# Patient Record
Sex: Female | Born: 1970 | Race: White | Hispanic: No | Marital: Single | Smoking: Never smoker
Health system: Southern US, Community
[De-identification: ages and names within clinical notes are randomized; demographics above are authoritative.]

## PROBLEM LIST (undated history)

## (undated) DIAGNOSIS — R0789 Other chest pain: Secondary | ICD-10-CM

## (undated) DIAGNOSIS — R002 Palpitations: Secondary | ICD-10-CM

## (undated) DIAGNOSIS — F32A Depression, unspecified: Secondary | ICD-10-CM

## (undated) DIAGNOSIS — F329 Major depressive disorder, single episode, unspecified: Secondary | ICD-10-CM

## (undated) DIAGNOSIS — C801 Malignant (primary) neoplasm, unspecified: Secondary | ICD-10-CM

## (undated) DIAGNOSIS — G43909 Migraine, unspecified, not intractable, without status migrainosus: Secondary | ICD-10-CM

## (undated) DIAGNOSIS — R739 Hyperglycemia, unspecified: Secondary | ICD-10-CM

## (undated) DIAGNOSIS — F319 Bipolar disorder, unspecified: Secondary | ICD-10-CM

## (undated) DIAGNOSIS — E162 Hypoglycemia, unspecified: Secondary | ICD-10-CM

## (undated) DIAGNOSIS — F988 Other specified behavioral and emotional disorders with onset usually occurring in childhood and adolescence: Secondary | ICD-10-CM

## (undated) HISTORY — DX: Other chest pain: R07.89

## (undated) HISTORY — PX: TUBAL LIGATION: SHX77

## (undated) HISTORY — DX: Palpitations: R00.2

---

## 1997-06-08 ENCOUNTER — Inpatient Hospital Stay (HOSPITAL_COMMUNITY): Admission: AD | Admit: 1997-06-08 | Discharge: 1997-06-08 | Payer: Self-pay | Admitting: Obstetrics

## 1997-06-13 ENCOUNTER — Inpatient Hospital Stay (HOSPITAL_COMMUNITY): Admission: AD | Admit: 1997-06-13 | Discharge: 1997-06-13 | Payer: Self-pay | Admitting: Obstetrics

## 1997-07-12 ENCOUNTER — Inpatient Hospital Stay (HOSPITAL_COMMUNITY): Admission: AD | Admit: 1997-07-12 | Discharge: 1997-07-12 | Payer: Self-pay | Admitting: *Deleted

## 1997-07-21 ENCOUNTER — Inpatient Hospital Stay (HOSPITAL_COMMUNITY): Admission: AD | Admit: 1997-07-21 | Discharge: 1997-07-21 | Payer: Self-pay | Admitting: Obstetrics & Gynecology

## 1997-12-17 ENCOUNTER — Emergency Department (HOSPITAL_COMMUNITY): Admission: EM | Admit: 1997-12-17 | Discharge: 1997-12-17 | Payer: Self-pay | Admitting: Emergency Medicine

## 1997-12-20 ENCOUNTER — Emergency Department (HOSPITAL_COMMUNITY): Admission: EM | Admit: 1997-12-20 | Discharge: 1997-12-20 | Payer: Self-pay | Admitting: Emergency Medicine

## 1999-10-05 ENCOUNTER — Emergency Department (HOSPITAL_COMMUNITY): Admission: EM | Admit: 1999-10-05 | Discharge: 1999-10-05 | Payer: Self-pay | Admitting: Emergency Medicine

## 1999-10-22 ENCOUNTER — Emergency Department (HOSPITAL_COMMUNITY): Admission: EM | Admit: 1999-10-22 | Discharge: 1999-10-22 | Payer: Self-pay | Admitting: Emergency Medicine

## 1999-12-11 ENCOUNTER — Emergency Department (HOSPITAL_COMMUNITY): Admission: EM | Admit: 1999-12-11 | Discharge: 1999-12-11 | Payer: Self-pay | Admitting: Emergency Medicine

## 1999-12-22 ENCOUNTER — Emergency Department (HOSPITAL_COMMUNITY): Admission: EM | Admit: 1999-12-22 | Discharge: 1999-12-22 | Payer: Self-pay | Admitting: *Deleted

## 2000-01-03 ENCOUNTER — Emergency Department (HOSPITAL_COMMUNITY): Admission: EM | Admit: 2000-01-03 | Discharge: 2000-01-03 | Payer: Self-pay | Admitting: Emergency Medicine

## 2000-01-03 ENCOUNTER — Encounter: Payer: Self-pay | Admitting: Emergency Medicine

## 2000-04-06 ENCOUNTER — Encounter: Payer: Self-pay | Admitting: Emergency Medicine

## 2000-04-06 ENCOUNTER — Emergency Department (HOSPITAL_COMMUNITY): Admission: EM | Admit: 2000-04-06 | Discharge: 2000-04-06 | Payer: Self-pay | Admitting: *Deleted

## 2000-07-28 ENCOUNTER — Inpatient Hospital Stay (HOSPITAL_COMMUNITY): Admission: AD | Admit: 2000-07-28 | Discharge: 2000-07-28 | Payer: Self-pay | Admitting: Obstetrics and Gynecology

## 2000-08-18 ENCOUNTER — Observation Stay (HOSPITAL_COMMUNITY): Admission: AD | Admit: 2000-08-18 | Discharge: 2000-08-18 | Payer: Self-pay

## 2000-08-26 ENCOUNTER — Inpatient Hospital Stay (HOSPITAL_COMMUNITY): Admission: AD | Admit: 2000-08-26 | Discharge: 2000-08-26 | Payer: Self-pay | Admitting: Obstetrics and Gynecology

## 2000-08-27 ENCOUNTER — Inpatient Hospital Stay (HOSPITAL_COMMUNITY): Admission: AD | Admit: 2000-08-27 | Discharge: 2000-08-27 | Payer: Self-pay | Admitting: Obstetrics and Gynecology

## 2000-09-10 ENCOUNTER — Other Ambulatory Visit: Admission: RE | Admit: 2000-09-10 | Discharge: 2000-09-10 | Payer: Self-pay | Admitting: Obstetrics and Gynecology

## 2000-10-22 ENCOUNTER — Ambulatory Visit (HOSPITAL_COMMUNITY): Admission: RE | Admit: 2000-10-22 | Discharge: 2000-10-22 | Payer: Self-pay | Admitting: Obstetrics and Gynecology

## 2000-10-22 ENCOUNTER — Encounter: Payer: Self-pay | Admitting: Obstetrics and Gynecology

## 2001-01-12 ENCOUNTER — Ambulatory Visit (HOSPITAL_COMMUNITY): Admission: RE | Admit: 2001-01-12 | Discharge: 2001-01-12 | Payer: Self-pay | Admitting: Obstetrics and Gynecology

## 2001-01-12 ENCOUNTER — Encounter: Payer: Self-pay | Admitting: Obstetrics and Gynecology

## 2001-01-19 ENCOUNTER — Inpatient Hospital Stay (HOSPITAL_COMMUNITY): Admission: AD | Admit: 2001-01-19 | Discharge: 2001-01-19 | Payer: Self-pay | Admitting: Obstetrics and Gynecology

## 2001-02-26 ENCOUNTER — Encounter: Payer: Self-pay | Admitting: Obstetrics and Gynecology

## 2001-02-26 ENCOUNTER — Ambulatory Visit (HOSPITAL_COMMUNITY): Admission: RE | Admit: 2001-02-26 | Discharge: 2001-02-26 | Payer: Self-pay | Admitting: Obstetrics and Gynecology

## 2001-03-19 ENCOUNTER — Inpatient Hospital Stay (HOSPITAL_COMMUNITY): Admission: AD | Admit: 2001-03-19 | Discharge: 2001-03-22 | Payer: Self-pay | Admitting: Obstetrics and Gynecology

## 2003-12-09 ENCOUNTER — Emergency Department (HOSPITAL_COMMUNITY): Admission: EM | Admit: 2003-12-09 | Discharge: 2003-12-09 | Payer: Self-pay | Admitting: *Deleted

## 2003-12-11 ENCOUNTER — Emergency Department (HOSPITAL_COMMUNITY): Admission: EM | Admit: 2003-12-11 | Discharge: 2003-12-11 | Payer: Self-pay | Admitting: Emergency Medicine

## 2004-01-30 ENCOUNTER — Emergency Department (HOSPITAL_COMMUNITY): Admission: EM | Admit: 2004-01-30 | Discharge: 2004-01-30 | Payer: Self-pay | Admitting: Family Medicine

## 2004-02-04 ENCOUNTER — Emergency Department (HOSPITAL_COMMUNITY): Admission: EM | Admit: 2004-02-04 | Discharge: 2004-02-04 | Payer: Self-pay | Admitting: Family Medicine

## 2004-05-01 ENCOUNTER — Emergency Department (HOSPITAL_COMMUNITY): Admission: EM | Admit: 2004-05-01 | Discharge: 2004-05-01 | Payer: Self-pay | Admitting: Family Medicine

## 2005-02-28 ENCOUNTER — Emergency Department (HOSPITAL_COMMUNITY): Admission: EM | Admit: 2005-02-28 | Discharge: 2005-02-28 | Payer: Self-pay | Admitting: Emergency Medicine

## 2005-05-01 ENCOUNTER — Emergency Department (HOSPITAL_COMMUNITY): Admission: EM | Admit: 2005-05-01 | Discharge: 2005-05-01 | Payer: Self-pay | Admitting: Family Medicine

## 2005-05-21 ENCOUNTER — Emergency Department (HOSPITAL_COMMUNITY): Admission: EM | Admit: 2005-05-21 | Discharge: 2005-05-21 | Payer: Self-pay | Admitting: Family Medicine

## 2005-06-02 ENCOUNTER — Emergency Department: Payer: Self-pay | Admitting: Emergency Medicine

## 2005-06-06 ENCOUNTER — Emergency Department: Payer: Self-pay | Admitting: Emergency Medicine

## 2005-07-25 ENCOUNTER — Emergency Department: Payer: Self-pay | Admitting: Emergency Medicine

## 2005-11-22 ENCOUNTER — Emergency Department: Payer: Self-pay | Admitting: Emergency Medicine

## 2005-11-27 ENCOUNTER — Emergency Department: Payer: Self-pay | Admitting: Emergency Medicine

## 2005-12-04 ENCOUNTER — Emergency Department: Payer: Self-pay | Admitting: Emergency Medicine

## 2005-12-07 ENCOUNTER — Emergency Department (HOSPITAL_COMMUNITY): Admission: EM | Admit: 2005-12-07 | Discharge: 2005-12-07 | Payer: Self-pay | Admitting: Emergency Medicine

## 2005-12-31 ENCOUNTER — Emergency Department (HOSPITAL_COMMUNITY): Admission: EM | Admit: 2005-12-31 | Discharge: 2005-12-31 | Payer: Self-pay | Admitting: Family Medicine

## 2008-02-29 ENCOUNTER — Emergency Department (HOSPITAL_COMMUNITY): Admission: EM | Admit: 2008-02-29 | Discharge: 2008-02-29 | Payer: Self-pay | Admitting: Emergency Medicine

## 2008-03-02 ENCOUNTER — Emergency Department (HOSPITAL_COMMUNITY): Admission: EM | Admit: 2008-03-02 | Discharge: 2008-03-02 | Payer: Self-pay | Admitting: Emergency Medicine

## 2008-03-03 ENCOUNTER — Ambulatory Visit (HOSPITAL_COMMUNITY): Admission: RE | Admit: 2008-03-03 | Discharge: 2008-03-03 | Payer: Self-pay | Admitting: Emergency Medicine

## 2008-03-06 ENCOUNTER — Emergency Department (HOSPITAL_COMMUNITY): Admission: EM | Admit: 2008-03-06 | Discharge: 2008-03-06 | Payer: Self-pay | Admitting: Emergency Medicine

## 2008-03-09 ENCOUNTER — Encounter: Admission: RE | Admit: 2008-03-09 | Discharge: 2008-03-09 | Payer: Self-pay | Admitting: Neurological Surgery

## 2008-03-30 ENCOUNTER — Ambulatory Visit (HOSPITAL_COMMUNITY): Admission: RE | Admit: 2008-03-30 | Discharge: 2008-03-31 | Payer: Self-pay | Admitting: Neurological Surgery

## 2008-04-22 ENCOUNTER — Inpatient Hospital Stay (HOSPITAL_COMMUNITY): Admission: AD | Admit: 2008-04-22 | Discharge: 2008-04-22 | Payer: Self-pay | Admitting: Obstetrics & Gynecology

## 2008-05-09 ENCOUNTER — Encounter: Admission: RE | Admit: 2008-05-09 | Discharge: 2008-05-09 | Payer: Self-pay | Admitting: Neurological Surgery

## 2008-07-25 ENCOUNTER — Encounter: Admission: RE | Admit: 2008-07-25 | Discharge: 2008-07-25 | Payer: Self-pay | Admitting: Neurological Surgery

## 2008-08-26 ENCOUNTER — Ambulatory Visit (HOSPITAL_COMMUNITY): Admission: RE | Admit: 2008-08-26 | Discharge: 2008-08-26 | Payer: Self-pay | Admitting: Obstetrics & Gynecology

## 2008-11-09 ENCOUNTER — Emergency Department (HOSPITAL_COMMUNITY): Admission: EM | Admit: 2008-11-09 | Discharge: 2008-11-09 | Payer: Self-pay | Admitting: Emergency Medicine

## 2010-06-14 IMAGING — RF DG CERVICAL SPINE 1V
1 series · 1 of 1 positions shown · non-contrast
Comparison: 03/03/2008.

CLINICAL DATA: C5-6 ACDF.

CERVICAL SPINE - 1 VIEW

[Series 1: run · 1 of 1 slices shown]
[im 1/1]
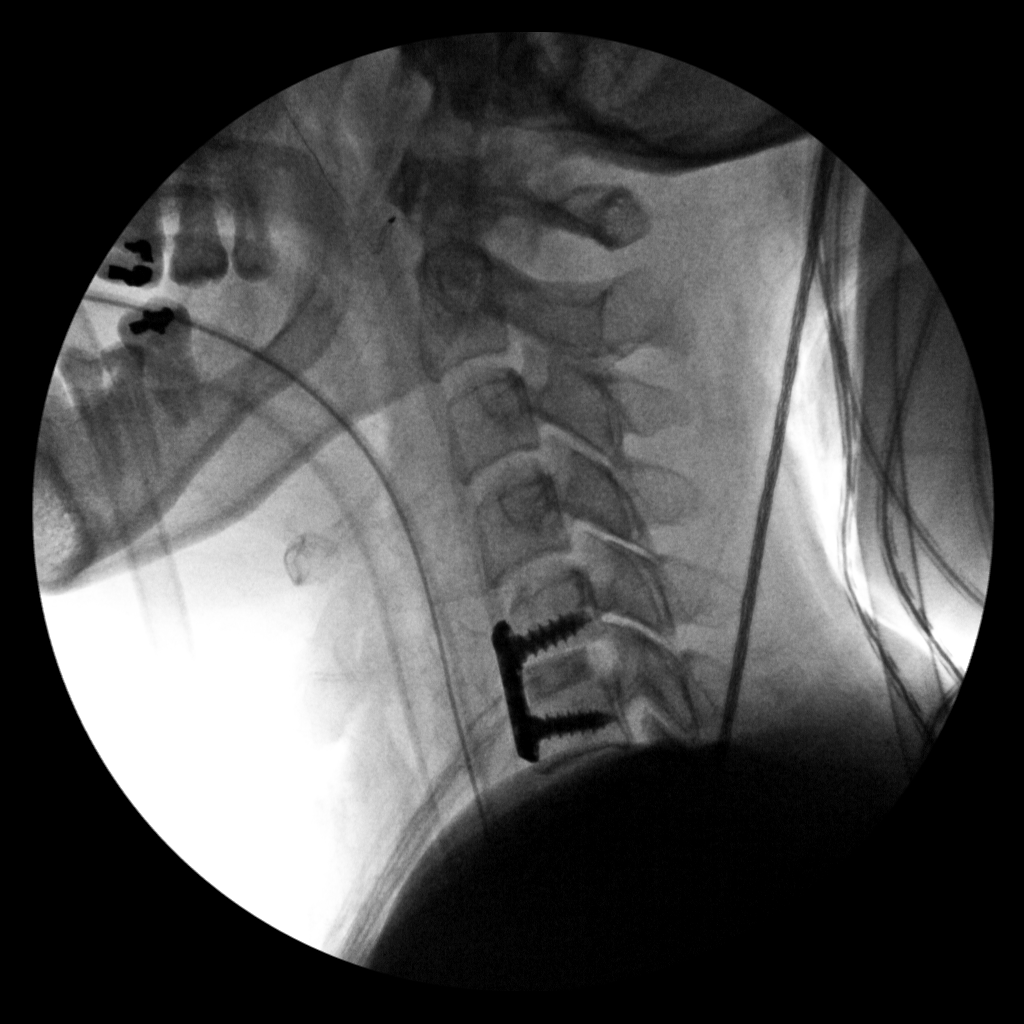

[1 of 1 positions shown; findings below may reference images not displayed]

FINDINGS: There has been anterior discectomy and fusion at C5-6.
Anterior plate and interbody bone graft at C5-6 are in good
position.  The alignment is normal and the remaining disc spaces
are intact.
IMPRESSION: Satisfactory ACDF at C5-6.

## 2010-06-18 LAB — CBC
MCHC: 34.1 g/dL (ref 30.0–36.0)
Platelets: 161 10*3/uL (ref 150–400)
RDW: 14.2 % (ref 11.5–15.5)

## 2010-06-18 LAB — PREGNANCY, URINE: Preg Test, Ur: NEGATIVE

## 2010-06-25 LAB — BASIC METABOLIC PANEL
BUN: 10 mg/dL (ref 6–23)
Calcium: 9.4 mg/dL (ref 8.4–10.5)
GFR calc non Af Amer: >60 mL/min (ref >60)
Glucose, Bld: 96 mg/dL (ref 70–99)
Potassium: 4.4 mEq/L (ref 3.5–5.1)
Sodium: 139 mEq/L (ref 135–145)

## 2010-06-25 LAB — APTT: aPTT: 25 seconds (ref 24–37)

## 2010-06-25 LAB — CBC
HCT: 39.8 % (ref 36.0–46.0)
Hemoglobin: 13.4 g/dL (ref 12.0–15.0)
Platelets: 175 10*3/uL (ref 150–400)
RDW: 12.9 % (ref 11.5–15.5)
WBC: 7.6 10*3/uL (ref 4.0–10.5)

## 2010-06-25 LAB — PROTIME-INR: Prothrombin Time: 13.6 seconds (ref 11.6–15.2)

## 2010-06-25 LAB — DIFFERENTIAL
Basophils Absolute: 0 10*3/uL (ref 0.0–0.1)
Eosinophils Relative: 1 % (ref 0–5)
Lymphocytes Relative: 25 % (ref 12–46)
Lymphs Abs: 1.9 10*3/uL (ref 0.7–4.0)
Neutro Abs: 5 10*3/uL (ref 1.7–7.7)

## 2010-06-26 LAB — CBC
HCT: 37.5 % (ref 36.0–46.0)
Hemoglobin: 12.6 g/dL (ref 12.0–15.0)
MCHC: 33.5 g/dL (ref 30.0–36.0)
MCV: 89.3 fL (ref 78.0–100.0)
Platelets: 162 10*3/uL (ref 150–400)
RBC: 4.21 MIL/uL (ref 3.87–5.11)
RDW: 13.5 % (ref 11.5–15.5)
WBC: 6.3 10*3/uL (ref 4.0–10.5)

## 2010-06-26 LAB — WET PREP, GENITAL
Clue Cells Wet Prep HPF POC: NONE SEEN
Trich, Wet Prep: NONE SEEN
WBC, Wet Prep HPF POC: NONE SEEN
Yeast Wet Prep HPF POC: NONE SEEN

## 2010-06-26 LAB — POCT PREGNANCY, URINE: Preg Test, Ur: NEGATIVE

## 2010-07-02 ENCOUNTER — Emergency Department (HOSPITAL_COMMUNITY)
Admission: EM | Admit: 2010-07-02 | Discharge: 2010-07-02 | Disposition: A | Discharge status: Home / Self Care | Payer: Self-pay | Attending: Emergency Medicine | Admitting: Emergency Medicine

## 2010-07-02 DIAGNOSIS — K047 Periapical abscess without sinus: Secondary | ICD-10-CM | POA: Insufficient documentation

## 2010-07-02 DIAGNOSIS — J45909 Unspecified asthma, uncomplicated: Secondary | ICD-10-CM | POA: Insufficient documentation

## 2010-07-02 DIAGNOSIS — R110 Nausea: Secondary | ICD-10-CM | POA: Insufficient documentation

## 2010-07-02 DIAGNOSIS — K089 Disorder of teeth and supporting structures, unspecified: Secondary | ICD-10-CM | POA: Insufficient documentation

## 2010-07-24 NOTE — Op Note (Signed)
NAME:  Colleen Mccullough, Colleen Mccullough           ACCOUNT NO.:  [id number]   MEDICAL RECORD NO.:  [id number]          PATIENT TYPE:  AMB   LOCATION:  SDC                           FACILITY:  [facility]   PHYSICIAN:  Roseanna Rainbow, M.D.DATE OF BIRTH:  1970/09/10   DATE OF PROCEDURE:  08/26/2008  DATE OF DISCHARGE:  08/26/2008                               OPERATIVE REPORT   PREOPERATIVE DIAGNOSIS:  Multiparity, desires sterilization procedure.   POSTOPERATIVE DIAGNOSES:  Multiparity, desires sterilization procedure,  pelvic adhesions.   PROCEDURE:  Laparoscopic bilateral tubal ligation with fulguration and  lysis of adhesions.   SURGEON:  Roseanna Rainbow, MD   ANESTHESIA:  General endotracheal.   FINDINGS:  Omental adhesions and adhesions involving the posterior cul-  de-sac.   PATHOLOGY:  None.   ESTIMATED BLOOD LOSS:  Minimal.   COMPLICATIONS:  None.   PROCEDURE:  The patient was taken to the operating room with an IV  running.  She was given general anesthesia and prepped and draped in the  usual sterile fashion.  A bivalve speculum was placed in the patient's  vagina.  The anterior lip of the cervix was then grasped with a single-  tooth tenaculum.  The Hulka manipulator was then advanced into the  cervix and secured to the anterior lip of the cervix with a means to  manipulate the uterus.  Single-tooth tenaculum and speculum were then  removed.  After a time-out had been completed, an infraumbilical skin  incision was then made with a scalpel.  Using the OptiVu, a 10-mm trocar  and sleeve were then advanced into the abdomen under direct  visualization.  The abdomen was then insufflated with CO2 gas.  A survey  of the pelvic viscera revealed a adhesions involving the posterior cul-  de-sac and sigmoid colon.  There was a peritoneal window noted.  There  are also filmy adhesions involving the omentum to the anterior abdominal  wall in the right pelvis.  Using Endo shears, the  above-noted omental  adhesions were divided.  The left fallopian tube was then identified and  followed out to the fimbriated end.  A 2-cm segment of the mid isthmic  portion of the tube was then cauterized.  With each application of the  bipolar device, the ohmmeter was noted to go to 0.  The right fallopian  tube was manipulated in a similar fashion.  All the instruments were  then removed from the abdomen.  The fascia was reapproximated with  interrupted suture of O Vicryl.  The skin was closed with Dermabond.  The Hulka manipulator  was removed from the vagina with minimal bleeding noted from the cervix.  At the close of the procedure, the instrument and pack counts were said  to be correct x2.  The patient was taken to the PACU awake and in stable  condition.      Roseanna Rainbow, M.D.  Electronically Signed     LAJ/MEDQ  D:  08/26/2008  T:  08/27/2008  Job:  [id number]

## 2010-07-24 NOTE — Op Note (Signed)
NAME:  Colleen Mccullough, ALBIN NO.:  [id number]   MEDICAL RECORD NO.:  [id number]          PATIENT TYPE:  OIB   LOCATION:  3533                         FACILITY:  [facility]   PHYSICIAN:  Tia Alert, MD     DATE OF BIRTH:  13-Sep-1970   DATE OF PROCEDURE:  03/30/2008  DATE OF DISCHARGE:                               OPERATIVE REPORT   PREOPERATIVE DIAGNOSIS:  Cervical disk herniation, C5-6 with neck and  left arm pain.   POSTOPERATIVE DIAGNOSIS:  Cervical disk herniation, C5-6 with neck and  left arm pain.   PROCEDURE:  1. Decompressive anterior cervical diskectomy, C5-6.  2. Anterior cervical arthrodesis, C5-6 utilizing a 6-mm      corticocancellous allograft.  3. Anterior cervical plating, C5-6 utilizing a Biomet plate.   SURGEON:  Tia Alert, MD   ASSISTANT:  Donalee Citrin, MD   ANESTHESIA:  General endotracheal.   COMPLICATIONS:  None apparent.   INDICATIONS FOR PROCEDURE:  [name] is a 40 year old female who is  referred for the emergency department with a large cervical disk  herniation at C5-6 with severe left arm pain.  She tried medical  management without relief.  I recommended decompressive anterior  cervical diskectomy at C5-6.  She understood the risks, benefits,  expected outcome, and wished to proceed.   DESCRIPTION OF PROCEDURE:  The patient was taken to operating room and  after induction of adequate generalized endotracheal anesthesia, she was  placed in a supine position on the operating room table.  Her right  anterior cervical region was prepped with DuraPrep and then draped in  usual sterile fashion.  A 3 mL of local anesthesia was injected and a  transverse incision was made to the right of midline and carried down to  the platysma, which was elevated, opened, and undermined with Metzenbaum  scissors.  I then dissected on the plane medial to the  sternocleidomastoid muscle and internal carotid artery and lateral to  the trachea and  esophagus to expose C5-6.  Intraoperative fluoroscopy  confirmed the level of C5-6 and then the annulus was incised and the  initial diskectomy was done with pituitary rongeurs and curved with  curettes.  I used the high-speed drill to drill the endplates to prepare  for further later arthrodesis.  I drilled down to the level of the  posterior longitudinal ligament, opened this with a nerve hook and then  removed it.  While undercutting the body of C5-C6 a large disk  herniation was noticed and removed at the C5-6 on the left.  The  bilateral foraminotomies were performed paying particular attention to  the left side because of her left-sided pain.  We identified the  pedicle, then identified the nerve root and marched along the nerve root  to decompress distally into its foramen.  Once our decompression was  complete, I palpated circumferentially with a nerve hook to assure  adequate decompression.  The nerve looked free and felt free.  Therefore, I irrigated with saline solution, dried all bleeding with  Gelfoam and with irrigation.  I then measured the interspace to be 6  mm  and tapped a 6-mm corticocancellous allograft into the interspace at C5-  6.  I then used a Biomet plate and placed two 13-mm variable angle  screws into the bodies of C5 and C6 and these locked in the plate by  locking mechanism within the plate.  I then spent considerable time  drying the surgical bed, irrigating the surgical bed, and once  meticulous hemostasis was achieved, closed the platysma with 3-0 Vicryl,  closed the subcuticular tissue with 3-0 Vicryl, and closed the skin with  benzoin and Steri-Strips.  The drapes were removed.  A sterile dressing  was applied.  The patient awakened from general anesthesia and  transferred to the recovery room in stable condition.  At the end of the  procedure, all sponge, needle, and instrument counts were correct.      Tia Alert, MD  Electronically  Signed     DSJ/MEDQ  D:  03/30/2008  T:  03/31/2008  Job:  [id number]

## 2010-07-27 NOTE — Discharge Summary (Signed)
[facility]  Patient:    Colleen Mccullough, Colleen Mccullough Visit Number: [id number] MRN: [id number]          Service Type: OBS Location: 910A 9131 01 Attending Physician:  Shaune Spittle Dictated by:   Mack Guise, C.N.M. Admit Date:  03/19/2001 Discharge Date: 03/22/2001                             Discharge Summary  ADMISSION DIAGNOSES: 1. Intrauterine pregnancy at term. 2. Prior cesarean section, desires repeat.  PROCEDURE:  Repeat low transverse cesarean section.  DISCHARGE DIAGNOSES: 1. Intrauterine pregnancy at term. 2. Prior cesarean section, desires repeat.  HISTORY OF PRESENT ILLNESS:  Ms. Joanette Gula is a gravida 5, para 1-0-3-1, who presented at term for repeat cesarean section by Dr. Dierdre Forth, with the birth of a 6 pound 4 ounce female infant named Brianna with Apgar scores of 8 at one minute and 9 at five minutes.  The patient has done well in the postpartum period.  Her hemoglobin on the first postpartum day was 8.1.  She has not suffered any ill effects from her anemia.  No syncope, no dizziness. She has opted not to have blood transfusion.  Otherwise, she has remained stable.  Her incision is clean and dry.  On this, her third postoperative day, she is deemed to be in satisfactory condition for discharge.  DISCHARGE INSTRUCTIONS:  Per [facility] handout.  DISCHARGE MEDICATIONS: 1. Percocet. 2. Motrin. 3. Prenatal vitamins. 4. Iron supplement b.i.d.  FOLLOWUP:  The patient will follow up at the office of [facility] in six weeks. Dictated by:   Mack Guise, C.N.M. Attending Physician:  Shaune Spittle DD:  03/22/01 TD:  03/23/01 Job: [id number]

## 2010-07-27 NOTE — Op Note (Signed)
[facility]  Patient:    Colleen Mccullough, Colleen Mccullough Visit Number: [id number] MRN: [id number]          Service Type: OBS Location: 910A 9131 01 Attending Physician:  Shaune Spittle Dictated by:   Maris Berger. Pennie Rushing, M.D. Proc. Date: 03/19/01 Admit Date:  03/19/2001                             Operative Report  PREOPERATIVE DIAGNOSES:       1. Intrauterine pregnancy at term.                               2. Prior cesarean section.                               3. Desire for repeat cesarean section.  POSTOPERATIVE DIAGNOSES:      1. Intrauterine pregnancy at term.                               2. Prior cesarean section.                               3. Desire for repeat cesarean section.  OPERATION:                    Repeat low transverse cesarean section.  SURGEON:                      Vanessa P. Pennie Rushing, M.D.  ASSISTANT:                    Nigel Bridgeman, C.N.M.  ANESTHESIA:                   Spinal.  ESTIMATED BLOOD LOSS:         Less than 750 cc.  COMPLICATIONS:                None.  FINDINGS:                     The patient was delivered of a female infant whose name is Colleen Mccullough, weighing 6 pounds and 4 ounces with Apgars of 8 and 9 at one and five minutes respectively.  Uterus, tubes, and ovaries were normal for the gravid state.  DESCRIPTION OF PROCEDURE:     The patient was taken to the operating room after appropriate identification, and placed on the operating table.  After placement of a spinal anesthetic, she was placed in the supine position with a left lateral tilt.  The abdomen and perineum were prepped with multiple layers of Betadine, and a Foley catheter inserted into the bladder, and connected to straight drainage.  The abdomen was draped as a sterile field.  After assurance of adequate anesthesia, a transverse incision was made at the site of the previous cesarean section incision per the patients request.   The abdomen was opened in layers.  The peritoneum was entered and the uterus incised approximately 2 cm above the uterovesical fold.  The infant was delivered from the occiput transverse position with the aid of a Kiwi vacuum extractor, and after having the nares and pharynx suctioned, and the  cord clamped and cut, was handed off to the awaiting pediatricians.  A loose nuchal cord had been reduced prior to delivery of the head.  The appropriate cord blood was drawn and the placenta noted to have separated from the uterus and was removed from the operative field.  The uterine incision was closed with a running interlocking suture of 0 Vicryl.  Hemostasis was noted to be adequate. Copious irrigation was carried out and the visceroperitoneum overlying the uterus repaired with a figure-of-eight suture of 2-0 Vicryl.  The abdominal peritoneum was then closed with a running suture of 2-0 Vicryl.  The rectus muscles were reapproximated in the midline with a figure-of-eight suture of 2-0 Vicryl.  The rectus muscles were noted to be hemostatic and irrigated. The rectus fascia was closed with a running suture of 0 Vicryl, and then reinforced on either side of the midline with figure-of-eight sutures of 0 Vicryl.  The subcutaneous tissue was irrigated and made hemostatic with Bovie cautery.  The skin incision was closed with a subcuticular suture of 3-0 Monocryl.  Steri-Strips were then applied and a sterile dressing applied.  The patient was taken from the operating room to the recovery room in satisfactory condition, having tolerated the procedure well with sponge and instrument counts correct.  The infant went to the full-term nursery. Dictated by:   Maris Berger. Pennie Rushing, M.D. Attending Physician:  Shaune Spittle DD:  03/19/01 TD:  03/19/01 Job: [id number]
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
## 2010-10-02 ENCOUNTER — Inpatient Hospital Stay (INDEPENDENT_AMBULATORY_CARE_PROVIDER_SITE_OTHER)
Admission: RE | Admit: 2010-10-02 | Discharge: 2010-10-02 | Disposition: A | Discharge status: Home / Self Care | Payer: Self-pay | Source: Ambulatory Visit | Attending: Emergency Medicine | Admitting: Emergency Medicine

## 2010-10-02 ENCOUNTER — Ambulatory Visit (INDEPENDENT_AMBULATORY_CARE_PROVIDER_SITE_OTHER): Payer: Self-pay

## 2010-10-02 DIAGNOSIS — M779 Enthesopathy, unspecified: Secondary | ICD-10-CM

## 2010-10-02 DIAGNOSIS — M795 Residual foreign body in soft tissue: Secondary | ICD-10-CM

## 2010-10-02 DIAGNOSIS — N390 Urinary tract infection, site not specified: Secondary | ICD-10-CM

## 2010-10-02 LAB — POCT URINALYSIS DIP (DEVICE)
Glucose, UA: NEGATIVE mg/dL
Nitrite: POSITIVE — AB
Protein, ur: 30 mg/dL — AB
Specific Gravity, Urine: >=1.03 (ref 1.005–1.030)
Urobilinogen, UA: 0.2 mg/dL (ref 0.0–1.0)
pH: 5.5 (ref 5.0–8.0)

## 2010-10-04 LAB — URINE CULTURE

## 2010-12-25 ENCOUNTER — Emergency Department (HOSPITAL_COMMUNITY)
Admission: EM | Admit: 2010-12-25 | Discharge: 2010-12-25 | Disposition: A | Discharge status: Home / Self Care | Payer: Self-pay | Attending: Emergency Medicine | Admitting: Emergency Medicine

## 2010-12-25 DIAGNOSIS — R350 Frequency of micturition: Secondary | ICD-10-CM | POA: Insufficient documentation

## 2010-12-25 DIAGNOSIS — F988 Other specified behavioral and emotional disorders with onset usually occurring in childhood and adolescence: Secondary | ICD-10-CM | POA: Insufficient documentation

## 2010-12-25 DIAGNOSIS — L819 Disorder of pigmentation, unspecified: Secondary | ICD-10-CM | POA: Insufficient documentation

## 2010-12-25 DIAGNOSIS — Z79899 Other long term (current) drug therapy: Secondary | ICD-10-CM | POA: Insufficient documentation

## 2010-12-25 DIAGNOSIS — J45909 Unspecified asthma, uncomplicated: Secondary | ICD-10-CM | POA: Insufficient documentation

## 2010-12-25 DIAGNOSIS — N390 Urinary tract infection, site not specified: Secondary | ICD-10-CM | POA: Insufficient documentation

## 2010-12-25 DIAGNOSIS — B360 Pityriasis versicolor: Secondary | ICD-10-CM | POA: Insufficient documentation

## 2010-12-25 DIAGNOSIS — R10819 Abdominal tenderness, unspecified site: Secondary | ICD-10-CM | POA: Insufficient documentation

## 2010-12-25 DIAGNOSIS — F341 Dysthymic disorder: Secondary | ICD-10-CM | POA: Insufficient documentation

## 2010-12-25 LAB — URINALYSIS, ROUTINE W REFLEX MICROSCOPIC
Glucose, UA: NEGATIVE mg/dL
Ketones, ur: 15 mg/dL — AB
Protein, ur: 100 mg/dL — AB
Urobilinogen, UA: 0.2 mg/dL (ref 0.0–1.0)

## 2010-12-25 LAB — URINE MICROSCOPIC-ADD ON

## 2011-05-14 ENCOUNTER — Encounter (HOSPITAL_COMMUNITY): Payer: Self-pay | Admitting: *Deleted

## 2011-05-14 ENCOUNTER — Emergency Department (HOSPITAL_COMMUNITY): Payer: Self-pay

## 2011-05-14 ENCOUNTER — Emergency Department (HOSPITAL_COMMUNITY)
Admission: EM | Admit: 2011-05-14 | Discharge: 2011-05-14 | Disposition: A | Discharge status: Home / Self Care | Payer: Self-pay | Attending: Emergency Medicine | Admitting: Emergency Medicine

## 2011-05-14 DIAGNOSIS — R05 Cough: Secondary | ICD-10-CM

## 2011-05-14 DIAGNOSIS — R059 Cough, unspecified: Secondary | ICD-10-CM | POA: Insufficient documentation

## 2011-05-14 DIAGNOSIS — M549 Dorsalgia, unspecified: Secondary | ICD-10-CM

## 2011-05-14 DIAGNOSIS — X58XXXA Exposure to other specified factors, initial encounter: Secondary | ICD-10-CM | POA: Insufficient documentation

## 2011-05-14 DIAGNOSIS — M545 Low back pain, unspecified: Secondary | ICD-10-CM | POA: Insufficient documentation

## 2011-05-14 DIAGNOSIS — IMO0002 Reserved for concepts with insufficient information to code with codable children: Secondary | ICD-10-CM | POA: Insufficient documentation

## 2011-05-14 DIAGNOSIS — R109 Unspecified abdominal pain: Secondary | ICD-10-CM | POA: Insufficient documentation

## 2011-05-14 HISTORY — DX: Hyperglycemia, unspecified: R73.9

## 2011-05-14 HISTORY — DX: Bipolar disorder, unspecified: F31.9

## 2011-05-14 HISTORY — DX: Migraine, unspecified, not intractable, without status migrainosus: G43.909

## 2011-05-14 LAB — POCT I-STAT, CHEM 8
BUN: 12 mg/dL (ref 6–23)
Calcium, Ion: 1.15 mmol/L (ref 1.12–1.32)
Creatinine, Ser: 0.9 mg/dL (ref 0.50–1.10)
Hemoglobin: 14.3 g/dL (ref 12.0–15.0)
Sodium: 141 mEq/L (ref 135–145)
TCO2: 26 mmol/L (ref 0–100)

## 2011-05-14 LAB — DIFFERENTIAL
Basophils Relative: 0 % (ref 0–1)
Lymphocytes Relative: 20 % (ref 12–46)
Lymphs Abs: 1.4 10*3/uL (ref 0.7–4.0)
Monocytes Absolute: 0.7 10*3/uL (ref 0.1–1.0)
Monocytes Relative: 10 % (ref 3–12)
Neutro Abs: 5.1 10*3/uL (ref 1.7–7.7)
Neutrophils Relative %: 70 % (ref 43–77)

## 2011-05-14 LAB — CBC
HCT: 40 % (ref 36.0–46.0)
Hemoglobin: 13 g/dL (ref 12.0–15.0)
RBC: 4.64 MIL/uL (ref 3.87–5.11)
WBC: 7.3 10*3/uL (ref 4.0–10.5)

## 2011-05-14 LAB — URINALYSIS, ROUTINE W REFLEX MICROSCOPIC
Hgb urine dipstick: NEGATIVE
Nitrite: NEGATIVE
Specific Gravity, Urine: 1.028 (ref 1.005–1.030)
Urobilinogen, UA: 0.2 mg/dL (ref 0.0–1.0)
pH: 5.5 (ref 5.0–8.0)

## 2011-05-14 LAB — POCT PREGNANCY, URINE: Preg Test, Ur: NEGATIVE

## 2011-05-14 LAB — URINE MICROSCOPIC-ADD ON

## 2011-05-14 MED ORDER — CYCLOBENZAPRINE HCL 10 MG PO TABS: 10.0000 mg | ORAL_TABLET | Freq: Two times a day (BID) | ORAL | Status: AC | PRN

## 2011-05-14 MED ORDER — KETOROLAC TROMETHAMINE 60 MG/2ML IM SOLN
60.0000 mg | Freq: Once | INTRAMUSCULAR | Status: AC
  Administered 2011-05-14: 60 mg via INTRAMUSCULAR
  Filled 2011-05-14: qty 2

## 2011-05-14 MED ORDER — KETOROLAC TROMETHAMINE 30 MG/ML IJ SOLN
30.0000 mg | Freq: Once | INTRAMUSCULAR | Status: DC
  Filled 2011-05-14: qty 1

## 2011-05-14 MED ORDER — NAPROXEN 500 MG PO TABS: 500.0000 mg | ORAL_TABLET | Freq: Two times a day (BID) | ORAL | Status: DC

## 2011-05-14 MED ORDER — BENZONATATE 100 MG PO CAPS: 100.0000 mg | ORAL_CAPSULE | Freq: Three times a day (TID) | ORAL | Status: AC

## 2011-05-14 NOTE — ED Provider Notes (Signed)
History     CSN: [id number]  Arrival date & time 05/14/11  1322   First MD Initiated Contact with Patient 05/14/11 1646     HPI Reports 4 days ago he began having bilateral lower back pain. States pain radiated to bilateral flanks. Os reports fever up to 102, and a cough. Sinus congestion, urinary symptoms, vaginal symptoms, diarrhea, constipation, vaginal discharge, vaginal bleeding, numbness, tingling, weakness, saddle anesthesias, perineal numbness, incontinence. Patient states pain is worse with certain movements and palpation.  Patient is a 41 y.o. female presenting with back pain.  Back Pain  This is a new problem. The current episode started more than 2 days ago. The problem occurs constantly. The problem has not changed since onset.The pain is associated with no known injury. The quality of the pain is described as aching. Radiates to: bilateral flanks. The pain is moderate. The symptoms are aggravated by certain positions. Pertinent negatives include no chest pain, no fever, no numbness, no headaches, no abdominal pain, no bowel incontinence, no perianal numbness, no bladder incontinence, no dysuria, no pelvic pain, no leg pain, no paresthesias, no paresis, no tingling and no weakness.    Past Medical History  Diagnosis Date  . Bipolar 1 disorder   . Hyperglycemia   . Migraines     History reviewed. No pertinent past surgical history.  History reviewed. No pertinent family history.  History  Substance Use Topics  . Smoking status: Not on file  . Smokeless tobacco: Not on file  . Alcohol Use: No    OB History    Grav Para Term Preterm Abortions TAB SAB Ect Mult Living                  Review of Systems  Constitutional: Negative for fever and chills.  HENT: Negative for neck pain and neck stiffness.   Respiratory: Negative for shortness of breath.   Cardiovascular: Negative for chest pain.  Gastrointestinal: Negative for nausea, vomiting, abdominal pain, diarrhea,  constipation and bowel incontinence.  Genitourinary: Positive for flank pain. Negative for bladder incontinence, dysuria, urgency, frequency, hematuria, vaginal discharge, vaginal pain and pelvic pain.  Musculoskeletal: Positive for back pain.       Denies saddle anesthesias, or perineal numbness, urinary or bowel incontinence  Neurological: Negative for tingling, weakness, numbness, headaches and paresthesias.  All other systems reviewed and are negative.    Allergies  Amoxicillin; Latex; and Phenergan  Home Medications   Current Outpatient Rx  Name Route Sig Dispense Refill  . TYLENOL CHEST CONGESTION PO Oral Take 10 mLs by mouth daily as needed. Cough and congestion    . ZIPRASIDONE HCL 40 MG PO CAPS Oral Take 40 mg by mouth daily.      BP 109/68  Pulse 89  Temp(Src) 99 F (37.2 C) (Oral)  Resp 22  SpO2 100%  LMP 04/29/2011  Physical Exam  Vitals reviewed. Constitutional: She is oriented to person, place, and time. Vital signs are normal. She appears well-developed and well-nourished.  HENT:  Head: Normocephalic and atraumatic.  Eyes: Conjunctivae are normal. Pupils are equal, round, and reactive to light.  Neck: Normal range of motion. Neck supple.  Cardiovascular: Normal rate, regular rhythm and normal heart sounds.  Exam reveals no friction rub.   No murmur heard. Pulmonary/Chest: Effort normal and breath sounds normal. She has no wheezes. She has no rhonchi. She has no rales. She exhibits no tenderness.  Abdominal: Soft. Bowel sounds are normal. She exhibits no distension and no  mass. There is no tenderness. There is CVA tenderness (bilateral). There is no rebound and no guarding.  Musculoskeletal: Normal range of motion.  Neurological: She is alert and oriented to person, place, and time. Coordination normal.  Skin: Skin is warm and dry. No rash noted. No erythema. No pallor.    ED Course  Procedures   Results for orders placed during the hospital encounter of  05/14/11  CBC      Component Value Range   WBC 7.3  4.0 - 10.5 (K/uL)   RBC 4.64  3.87 - 5.11 (MIL/uL)   Hemoglobin 13.0  12.0 - 15.0 (g/dL)   HCT 14.7  82.9 - 56.2 (%)   MCV 86.2  78.0 - 100.0 (fL)   MCH 28.0  26.0 - 34.0 (pg)   MCHC 32.5  30.0 - 36.0 (g/dL)   RDW 13.0  86.5 - 78.4 (%)   Platelets 136 (*) 150 - 400 (K/uL)  DIFFERENTIAL      Component Value Range   Neutrophils Relative 70  43 - 77 (%)   Neutro Abs 5.1  1.7 - 7.7 (K/uL)   Lymphocytes Relative 20  12 - 46 (%)   Lymphs Abs 1.4  0.7 - 4.0 (K/uL)   Monocytes Relative 10  3 - 12 (%)   Monocytes Absolute 0.7  0.1 - 1.0 (K/uL)   Eosinophils Relative 0  0 - 5 (%)   Eosinophils Absolute 0.0  0.0 - 0.7 (K/uL)   Basophils Relative 0  0 - 1 (%)   Basophils Absolute 0.0  0.0 - 0.1 (K/uL)  URINALYSIS, ROUTINE W REFLEX MICROSCOPIC      Component Value Range   Color, Urine YELLOW  YELLOW    APPearance CLOUDY (*) CLEAR    Specific Gravity, Urine 1.028  1.005 - 1.030    pH 5.5  5.0 - 8.0    Glucose, UA NEGATIVE  NEGATIVE (mg/dL)   Hgb urine dipstick NEGATIVE  NEGATIVE    Bilirubin Urine SMALL (*) NEGATIVE    Ketones, ur 15 (*) NEGATIVE (mg/dL)   Protein, ur 30 (*) NEGATIVE (mg/dL)   Urobilinogen, UA 0.2  0.0 - 1.0 (mg/dL)   Nitrite NEGATIVE  NEGATIVE    Leukocytes, UA NEGATIVE  NEGATIVE   POCT PREGNANCY, URINE      Component Value Range   Preg Test, Ur NEGATIVE  NEGATIVE   URINE MICROSCOPIC-ADD ON      Component Value Range   Squamous Epithelial / LPF MANY (*) RARE    WBC, UA 3-6  <3 (WBC/hpf)   Bacteria, UA MANY (*) RARE    Urine-Other MUCOUS PRESENT    POCT I-STAT, CHEM 8      Component Value Range   Sodium 141  135 - 145 (mEq/L)   Potassium 3.8  3.5 - 5.1 (mEq/L)   Chloride 104  96 - 112 (mEq/L)   BUN 12  6 - 23 (mg/dL)   Creatinine, Ser 6.96  0.50 - 1.10 (mg/dL)   Glucose, Bld 97  70 - 99 (mg/dL)   Calcium, Ion 2.95  2.84 - 1.32 (mmol/L)   TCO2 26  0 - 100 (mmol/L)   Hemoglobin 14.3  12.0 - 15.0 (g/dL)    HCT 13.2  44.0 - 10.2 (%)   Dg Chest 2 View  05/14/2011  *RADIOLOGY REPORT*  Clinical Data: Cough, fever, chills  CHEST - 2 VIEW  Comparison: None.  Findings: Normal heart size and vascularity.  Negative for pneumonia, edema, collapse, consolidation, effusion  or pneumothorax.  Trachea midline.  Lower cervical fusion hardware noted.  IMPRESSION: No acute chest process  Original Report Authenticated By: Judie Petit. Ruel Favors, M.D.     MDM   7:02 PM Discussed labs and imaging with patient. Will treat patient for cough and muscle strain. Reports some improvement with IM Toradol. Discussed plan with Dr. Ranae Palms.     Thomasene Lot, PA-C 05/14/11 1903

## 2011-05-14 NOTE — ED Notes (Signed)
Pt. Has c/o fever, right flank and lower abdominal pain.

## 2011-05-14 NOTE — ED Notes (Signed)
Patient states onset 3 days ago bilateral flank pain radiating to bilateral lower extremity to knees. Pain achy sore pain increases with ambulating.  Steady gait.  Denies urinary complaints.  Airway intact bilateral equal chest rise and fall.  Developed fever 2-3 days ago 102.0,  Ax4 Resting comfortably on stretcher. Family member at bedside.

## 2011-05-14 NOTE — Discharge Instructions (Signed)
Back Pain, Adult Low back pain is very common. About 1 in 5 people have back pain.The cause of low back pain is rarely dangerous. The pain often gets better over time.About half of people with a sudden onset of back pain feel better in just 2 weeks. About 8 in 10 people feel better by 6 weeks.  CAUSES Some common causes of back pain include:  Strain of the muscles or ligaments supporting the spine.   Wear and tear (degeneration) of the spinal discs.   Arthritis.   Direct injury to the back.  DIAGNOSIS Most of the time, the direct cause of low back pain is not known.However, back pain can be treated effectively even when the exact cause of the pain is unknown.Answering your caregiver's questions about your overall health and symptoms is one of the most accurate ways to make sure the cause of your pain is not dangerous. If your caregiver needs more information, he or she may order lab work or imaging tests (X-rays or MRIs).However, even if imaging tests show changes in your back, this usually does not require surgery. HOME CARE INSTRUCTIONS For many people, back pain returns.Since low back pain is rarely dangerous, it is often a condition that people can learn to manageon their own.   Remain active. It is stressful on the back to sit or stand in one place. Do not sit, drive, or stand in one place for more than 30 minutes at a time. Take short walks on level surfaces as soon as pain allows.Try to increase the length of time you walk each day.   Do not stay in bed.Resting more than 1 or 2 days can delay your recovery.   Do not avoid exercise or work.Your body is made to move.It is not dangerous to be active, even though your back may hurt.Your back will likely heal faster if you return to being active before your pain is gone.   Pay attention to your body when you bend and lift. Many people have less discomfortwhen lifting if they bend their knees, keep the load close to their  bodies,and avoid twisting. Often, the most comfortable positions are those that put less stress on your recovering back.   Find a comfortable position to sleep. Use a firm mattress and lie on your side with your knees slightly bent. If you lie on your back, put a pillow under your knees.   Only take over-the-counter or prescription medicines as directed by your caregiver. Over-the-counter medicines to reduce pain and inflammation are often the most helpful.Your caregiver may prescribe muscle relaxant drugs.These medicines help dull your pain so you can more quickly return to your normal activities and healthy exercise.   Put ice on the injured area.   Put ice in a plastic bag.   Place a towel between your skin and the bag.   Leave the ice on for 15 to 20 minutes, 3 to 4 times a day for the first 2 to 3 days. After that, ice and heat may be alternated to reduce pain and spasms.   Ask your caregiver about trying back exercises and gentle massage. This may be of some benefit.   Avoid feeling anxious or stressed.Stress increases muscle tension and can worsen back pain.It is important to recognize when you are anxious or stressed and learn ways to manage it.Exercise is a great option.  SEEK MEDICAL CARE IF:  You have pain that is not relieved with rest or medicine.   You have   pain that does not improve in 1 week.   You have new symptoms.   You are generally not feeling well.  SEEK IMMEDIATE MEDICAL CARE IF:   You have pain that radiates from your back into your legs.   You develop new bowel or bladder control problems.   You have unusual weakness or numbness in your arms or legs.   You develop nausea or vomiting.   You develop abdominal pain.   You feel faint.  Document Released: 02/25/2005 Document Revised: 02/14/2011 Document Reviewed: 07/16/2010 [facility] Patient Information 2012 [place], [place].  Back Exercises Back exercises help treat and prevent back injuries. The  goal of back exercises is to increase the strength of your abdominal and back muscles and the flexibility of your back. These exercises should be started when you no longer have back pain. Back exercises include:  Pelvic Tilt. Lie on your back with your knees bent. Tilt your pelvis until the lower part of your back is against the floor. Hold this position 5 to 10 sec and repeat 5 to 10 times.   Knee to Chest. Pull first 1 knee up against your chest and hold for 20 to 30 seconds, repeat this with the other knee, and then both knees. This may be done with the other leg straight or bent, whichever feels better.   Sit-Ups or Curl-Ups. Bend your knees 90 degrees. Start with tilting your pelvis, and do a partial, slow sit-up, lifting your trunk only 30 to 45 degrees off the floor. Take at least 2 to 3 seconds for each sit-up. Do not do sit-ups with your knees out straight. If partial sit-ups are difficult, simply do the above but with only tightening your abdominal muscles and holding it as directed.   Hip-Lift. Lie on your back with your knees flexed 90 degrees. Push down with your feet and shoulders as you raise your hips a couple inches off the floor; hold for 10 seconds, repeat 5 to 10 times.   Back arches. Lie on your stomach, propping yourself up on bent elbows. Slowly press on your hands, causing an arch in your low back. Repeat 3 to 5 times. Any initial stiffness and discomfort should lessen with repetition over time.   Shoulder-Lifts. Lie face down with arms beside your body. Keep hips and torso pressed to floor as you slowly lift your head and shoulders off the floor.  Do not overdo your exercises, especially in the beginning. Exercises may cause you some mild back discomfort which lasts for a few minutes; however, if the pain is more severe, or lasts for more than 15 minutes, do not continue exercises until you see your caregiver. Improvement with exercise therapy for back problems is slow.  See  your caregivers for assistance with developing a proper back exercise program. Document Released: 04/04/2004 Document Revised: 02/14/2011 Document Reviewed: 02/25/2005 [facility] Patient Information 2012 [place], [place].  Cough, Adult  A cough is a reflex that helps clear your throat and airways. It can help heal the body or may be a reaction to an irritated airway. A cough may only last 2 or 3 weeks (acute) or may last more than 8 weeks (chronic).  CAUSES Acute cough:  Viral or bacterial infections.  Chronic cough:  Infections.   Allergies.   Asthma.   Post-nasal drip.   Smoking.   Heartburn or acid reflux.   Some medicines.   Chronic lung problems (COPD).   Cancer.  SYMPTOMS   Cough.   Fever.  Chest pain.   Increased breathing rate.   High-pitched whistling sound when breathing (wheezing).   Colored mucus that you cough up (sputum).  TREATMENT   A bacterial cough may be treated with antibiotic medicine.   A viral cough must run its course and will not respond to antibiotics.   Your caregiver may recommend other treatments if you have a chronic cough.  HOME CARE INSTRUCTIONS   Only take over-the-counter or prescription medicines for pain, discomfort, or fever as directed by your caregiver. Use cough suppressants only as directed by your caregiver.   Use a cold steam vaporizer or humidifier in your bedroom or home to help loosen secretions.   Sleep in a semi-upright position if your cough is worse at night.   Rest as needed.   Stop smoking if you smoke.  SEEK IMMEDIATE MEDICAL CARE IF:   You have pus in your sputum.   Your cough starts to worsen.   You cannot control your cough with suppressants and are losing sleep.   You begin coughing up blood.   You have difficulty breathing.   You develop pain which is getting worse or is uncontrolled with medicine.   You have a fever.  MAKE SURE YOU:   Understand these instructions.   Will watch your  condition.   Will get help right away if you are not doing well or get worse.  Document Released: 08/24/2010 Document Revised: 02/14/2011 Document Reviewed: 08/24/2010 [facility] Patient Information 2012 [place], [place].

## 2011-05-14 NOTE — ED Provider Notes (Signed)
Medical screening examination/treatment/procedure(s) were performed by non-physician practitioner and as supervising physician I was immediately available for consultation/collaboration.   Loren Racer, MD 05/14/11 2123

## 2011-05-14 NOTE — ED Notes (Signed)
Pain med given IM. Pt tolerated well. Lab in room now.

## 2011-05-14 NOTE — ED Notes (Signed)
Pt refused IV, she stated "I hate needles, please don't do it". MD notified and pain med changed to IM.

## 2011-06-19 ENCOUNTER — Emergency Department (HOSPITAL_COMMUNITY)
Admission: EM | Admit: 2011-06-19 | Discharge: 2011-06-19 | Disposition: A | Discharge status: Home / Self Care | Payer: Self-pay | Attending: Emergency Medicine | Admitting: Emergency Medicine

## 2011-06-19 ENCOUNTER — Encounter (HOSPITAL_COMMUNITY): Payer: Self-pay | Admitting: *Deleted

## 2011-06-19 ENCOUNTER — Emergency Department (HOSPITAL_COMMUNITY): Payer: Self-pay

## 2011-06-19 DIAGNOSIS — F319 Bipolar disorder, unspecified: Secondary | ICD-10-CM | POA: Insufficient documentation

## 2011-06-19 DIAGNOSIS — F988 Other specified behavioral and emotional disorders with onset usually occurring in childhood and adolescence: Secondary | ICD-10-CM | POA: Insufficient documentation

## 2011-06-19 DIAGNOSIS — J45909 Unspecified asthma, uncomplicated: Secondary | ICD-10-CM | POA: Insufficient documentation

## 2011-06-19 DIAGNOSIS — R1110 Vomiting, unspecified: Secondary | ICD-10-CM | POA: Insufficient documentation

## 2011-06-19 DIAGNOSIS — R197 Diarrhea, unspecified: Secondary | ICD-10-CM | POA: Insufficient documentation

## 2011-06-19 DIAGNOSIS — J40 Bronchitis, not specified as acute or chronic: Secondary | ICD-10-CM | POA: Insufficient documentation

## 2011-06-19 HISTORY — DX: Depression, unspecified: F32.A

## 2011-06-19 HISTORY — DX: Major depressive disorder, single episode, unspecified: F32.9

## 2011-06-19 HISTORY — DX: Other specified behavioral and emotional disorders with onset usually occurring in childhood and adolescence: F98.8

## 2011-06-19 LAB — PREGNANCY, URINE: Preg Test, Ur: NEGATIVE

## 2011-06-19 LAB — URINALYSIS, ROUTINE W REFLEX MICROSCOPIC
Glucose, UA: NEGATIVE mg/dL
Hgb urine dipstick: NEGATIVE
Ketones, ur: 40 mg/dL — AB
Protein, ur: NEGATIVE mg/dL
Urobilinogen, UA: 0.2 mg/dL (ref 0.0–1.0)

## 2011-06-19 MED ORDER — ONDANSETRON 4 MG PO TBDP
8.0000 mg | ORAL_TABLET | Freq: Once | ORAL | Status: AC
  Administered 2011-06-19: 8 mg via ORAL
  Filled 2011-06-19: qty 2

## 2011-06-19 MED ORDER — PREDNISONE 10 MG PO TABS: 20.0000 mg | ORAL_TABLET | Freq: Every day | ORAL | Status: DC

## 2011-06-19 MED ORDER — ONDANSETRON HCL 4 MG/2ML IJ SOLN
4.0000 mg | Freq: Once | INTRAMUSCULAR | Status: AC
  Administered 2011-06-19: 4 mg via INTRAMUSCULAR
  Filled 2011-06-19: qty 2

## 2011-06-19 MED ORDER — ALBUTEROL SULFATE HFA 108 (90 BASE) MCG/ACT IN AERS: 1.0000 | INHALATION_SPRAY | Freq: Four times a day (QID) | RESPIRATORY_TRACT | Status: DC | PRN

## 2011-06-19 MED ORDER — ONDANSETRON HCL 4 MG PO TABS: 4.0000 mg | ORAL_TABLET | Freq: Four times a day (QID) | ORAL | Status: AC

## 2011-06-19 NOTE — ED Notes (Signed)
Patient transported to X-ray 

## 2011-06-19 NOTE — Discharge Instructions (Signed)
Antibiotic Nonuse  Your caregiver felt that the infection or problem was not one that would be helped with an antibiotic. Infections may be caused by viruses or bacteria. Only a caregiver can tell which one of these is the likely cause of an illness. A cold is the most common cause of infection in both adults and children. A cold is a virus. Antibiotic treatment will have no effect on a viral infection. Viruses can lead to many lost days of work caring for sick children and many missed days of school. Children may catch as many as 10 "colds" or "flus" per year during which they can be tearful, cranky, and uncomfortable. The goal of treating a virus is aimed at keeping the ill person comfortable. Antibiotics are medications used to help the body fight bacterial infections. There are relatively few types of bacteria that cause infections but there are hundreds of viruses. While both viruses and bacteria cause infection they are very different types of germs. A viral infection will typically go away by itself within 7 to 10 days. Bacterial infections may spread or get worse without antibiotic treatment. Examples of bacterial infections are:  Sore throats (like strep throat or tonsillitis).   Infection in the lung (pneumonia).   Ear and skin infections.  Examples of viral infections are:  Colds or flus.   Most coughs and bronchitis.   Sore throats not caused by Strep.   Runny noses.  It is often best not to take an antibiotic when a viral infection is the cause of the problem. Antibiotics can kill off the helpful bacteria that we have inside our body and allow harmful bacteria to start growing. Antibiotics can cause side effects such as allergies, nausea, and diarrhea without helping to improve the symptoms of the viral infection. Additionally, repeated uses of antibiotics can cause bacteria inside of our body to become resistant. That resistance can be passed onto harmful bacterial. The next time  you have an infection it may be harder to treat if antibiotics are used when they are not needed. Not treating with antibiotics allows our own immune system to develop and take care of infections more efficiently. Also, antibiotics will work better for [place] when they are prescribed for bacterial infections. Treatments for a child that is ill may include:  Give extra fluids throughout the day to stay hydrated.   Get plenty of rest.   Only give your child over-the-counter or prescription medicines for pain, discomfort, or fever as directed by your caregiver.   The use of a cool mist humidifier may help stuffy noses.   Cold medications if suggested by your caregiver.  Your caregiver may decide to start you on an antibiotic if:  The problem you were seen for today continues for a longer length of time than expected.   You develop a secondary bacterial infection.  SEEK MEDICAL CARE IF:  Fever lasts longer than 5 days.   Symptoms continue to get worse after 5 to 7 days or become severe.   Difficulty in breathing develops.   Signs of dehydration develop (poor drinking, rare urinating, dark colored urine).   Changes in behavior or worsening tiredness (listlessness or lethargy).  Document Released: 05/06/2001 Document Revised: 02/14/2011 Document Reviewed: 11/02/2008 [facility] Patient Information 2012 [place], [place].Bronchitis Bronchitis is the body's way of reacting to injury and/or infection (inflammation) of the bronchi. Bronchi are the air tubes that extend from the windpipe into the lungs. If the inflammation becomes severe, it may cause shortness  of breath. CAUSES  Inflammation may be caused by:  A virus.   Germs (bacteria).   Dust.   Allergens.   Pollutants and many other irritants.  The cells lining the bronchial tree are covered with tiny hairs (cilia). These constantly beat upward, away from the lungs, toward the mouth. This keeps the lungs free of pollutants. When these  cells become too irritated and are unable to do their job, mucus begins to develop. This causes the characteristic cough of bronchitis. The cough clears the lungs when the cilia are unable to do their job. Without either of these protective mechanisms, the mucus would settle in the lungs. Then you would develop pneumonia. Smoking is a common cause of bronchitis and can contribute to pneumonia. Stopping this habit is the single most important thing you can do to help yourself. TREATMENT   Your caregiver may prescribe an antibiotic if the cough is caused by bacteria. Also, medicines that open up your airways make it easier to breathe. Your caregiver may also recommend or prescribe an expectorant. It will loosen the mucus to be coughed up. Only take over-the-counter or prescription medicines for pain, discomfort, or fever as directed by your caregiver.   Removing whatever causes the problem (smoking, for example) is critical to preventing the problem from getting worse.   Cough suppressants may be prescribed for relief of cough symptoms.   Inhaled medicines may be prescribed to help with symptoms now and to help prevent problems from returning.   For those with recurrent (chronic) bronchitis, there may be a need for steroid medicines.  SEEK IMMEDIATE MEDICAL CARE IF:   During treatment, you develop more pus-like mucus (purulent sputum).   You have a fever.   Your baby is older than 3 months with a rectal temperature of 102 F (38.9 C) or higher.   Your baby is 30 months old or younger with a rectal temperature of 100.4 F (38 C) or higher.   You become progressively more ill.   You have increased difficulty breathing, wheezing, or shortness of breath.  It is necessary to seek immediate medical care if you are elderly or sick from any other disease. MAKE SURE YOU:   Understand these instructions.   Will watch your condition.   Will get help right away if you are not doing well or get  worse.  Document Released: 02/25/2005 Document Revised: 02/14/2011 Document Reviewed: 01/05/2008 [facility] Patient Information 2012 [place], [place].

## 2011-06-19 NOTE — ED Notes (Signed)
MD requested oral trial, gave pt ginger ale pt reports vomiting after drinking ginger ale. Small amount of emesis noted in basin. Will make MD aware.

## 2011-06-19 NOTE — ED Provider Notes (Signed)
History     CSN: [id number]  Arrival date & time 06/19/11  1304   First MD Initiated Contact with Patient 06/19/11 1545      Chief Complaint  Patient presents with  . Cough  . Emesis  . Diarrhea    (Consider location/radiation/quality/duration/timing/severity/associated sxs/prior treatment) Patient is a 41 y.o. female presenting with cough, vomiting, and diarrhea. The history is provided by the patient.  Cough  Emesis  Associated symptoms include cough and diarrhea.  Diarrhea The primary symptoms include vomiting and diarrhea.   patient here with nonproductive cough, diarrhea, vomiting x3 days. Denies any dizziness or orthostatic changes. Diarrhea has been loose but not watery. She has had 3 episodes of emesis without severe abdominal pain. No urinary symptoms. Positive sick exposures. No medications used prior to arrival. Nothing makes her symptoms better or worse. No neck pain photophobia or rashes.  Past Medical History  Diagnosis Date  . Bipolar 1 disorder   . Hyperglycemia   . Migraines   . Asthma   . Depression   . ADD (attention deficit disorder)     History reviewed. No pertinent past surgical history.  History reviewed. No pertinent family history.  History  Substance Use Topics  . Smoking status: Not on file  . Smokeless tobacco: Not on file  . Alcohol Use: No    OB History    Grav Para Term Preterm Abortions TAB SAB Ect Mult Living                  Review of Systems  Respiratory: Positive for cough.   Gastrointestinal: Positive for vomiting and diarrhea.  All other systems reviewed and are negative.    Allergies  Amoxicillin; Darvocet; Latex; and Phenergan  Home Medications   Current Outpatient Rx  Name Route Sig Dispense Refill  . GABAPENTIN 100 MG PO CAPS Oral Take 100 mg by mouth daily as needed. As needed for bipolar/anxiety.    . METHYLPHENIDATE HCL 10 MG PO TABS Oral Take 5 mg by mouth daily.    Marland Kitchen ZIPRASIDONE HCL 20 MG PO CAPS Oral  Take 20 mg by mouth at bedtime.      BP 100/71  Pulse 99  Temp 98.8 F (37.1 C)  Resp 16  SpO2 97%  LMP 05/27/2011  Physical Exam  Nursing note and vitals reviewed. Constitutional: She is oriented to person, place, and time. Vital signs are normal. She appears well-developed and well-nourished.  Non-toxic appearance. No distress.  HENT:  Head: Normocephalic and atraumatic.  Eyes: Conjunctivae, EOM and lids are normal. Pupils are equal, round, and reactive to light.  Neck: Normal range of motion. Neck supple. No tracheal deviation present. No mass present.  Cardiovascular: Normal rate, regular rhythm and normal heart sounds.  Exam reveals no gallop.   No murmur heard. Pulmonary/Chest: Effort normal and breath sounds normal. No stridor. No respiratory distress. She has no decreased breath sounds. She has no wheezes. She has no rhonchi. She has no rales.  Abdominal: Soft. Normal appearance and bowel sounds are normal. She exhibits no distension. There is no tenderness. There is no rebound and no CVA tenderness.  Musculoskeletal: Normal range of motion. She exhibits no edema and no tenderness.  Neurological: She is alert and oriented to person, place, and time. She has normal strength. No cranial nerve deficit or sensory deficit. GCS eye subscore is 4. GCS verbal subscore is 5. GCS motor subscore is 6.  Skin: Skin is warm and dry. No abrasion and  no rash noted.  Psychiatric: She has a normal mood and affect. Her speech is normal and behavior is normal.    ED Course  Procedures (including critical care time)  Labs Reviewed - No data to display No results found.   No diagnosis found.    MDM  She given Zofran here and continued to have some emesis. She was offered IV fluids and has deferred. She'll be given prescription for her bronchitis and also anti-medics        Toy Baker, MD 06/19/11 [phone]

## 2011-06-19 NOTE — ED Notes (Signed)
Pt given water to drink and continues to have dry heaves. MD made aware. Pt still not wanting to have IV placed, requesting IM medications.

## 2011-06-19 NOTE — ED Notes (Signed)
Pt states nausea/vomiting/diarrhea x 2 days. Cough x 2 days, sore throat. Mild abdominal pain. Reports shortness of breath and chest pain when coughing. NAD noted.

## 2011-06-19 NOTE — ED Notes (Signed)
Reports n/v/d x 2 days, having productive cough. No acute distress noted at triage.

## 2011-06-20 LAB — URINE CULTURE: Colony Count: 7000

## 2011-08-09 ENCOUNTER — Encounter (HOSPITAL_COMMUNITY): Payer: Self-pay | Admitting: Emergency Medicine

## 2011-08-09 ENCOUNTER — Emergency Department (INDEPENDENT_AMBULATORY_CARE_PROVIDER_SITE_OTHER)
Admission: EM | Admit: 2011-08-09 | Discharge: 2011-08-09 | Disposition: A | Discharge status: Home / Self Care | Payer: Self-pay | Source: Home / Self Care | Attending: Emergency Medicine | Admitting: Emergency Medicine

## 2011-08-09 DIAGNOSIS — N3000 Acute cystitis without hematuria: Secondary | ICD-10-CM

## 2011-08-09 LAB — POCT URINALYSIS DIP (DEVICE)
Bilirubin Urine: NEGATIVE
Glucose, UA: NEGATIVE mg/dL
Leukocytes, UA: NEGATIVE
Nitrite: NEGATIVE
Urobilinogen, UA: 0.2 mg/dL (ref 0.0–1.0)

## 2011-08-09 MED ORDER — SULFAMETHOXAZOLE-TMP DS 800-160 MG PO TABS: 2.0000 | ORAL_TABLET | Freq: Once | ORAL | Status: AC

## 2011-08-09 NOTE — ED Provider Notes (Signed)
Chief Complaint  Patient presents with  . Urinary Urgency    History of Present Illness:   Colleen Mccullough is a 41 year old female has had a one week history of frequent urination, dysuria, and urgency. She denies any hematuria, fever, chills, nausea, vomiting, lower back, or pelvic pain. She's had no GYN complaints. She has had a urinary tract infection in the past with Escherichia coli, she can't remember exactly when. Her last menstrual period was April 22. She is not sexually active.  Review of Systems:  Other than noted above, the patient denies any of the following symptoms: General:  No fevers, chills, sweats, aches, or fatigue. GI:  No abdominal pain, back pain, nausea, vomiting, diarrhea, or constipation. GU:  No dysuria, frequency, urgency, hematuria, or incontinence. GYN:  No discharge, itching, vulvar pain or lesions, pelvic pain, or abnormal vaginal bleeding.  PMFSH:  Past medical history, family history, social history, meds, and allergies were reviewed.  Physical Exam:   Vital signs:  BP 113/77  Pulse 95  Temp(Src) 97.9 F (36.6 C) (Oral)  Resp 18  SpO2 100%  LMP 07/01/2011 Gen:  Alert, oriented, in no distress. Lungs:  Clear to auscultation, no wheezes, rales or rhonchi. Heart:  Regular rhythm, no gallop or murmer. Abdomen:  Flat and soft. There was slight suprapubic pain to palpation.  No guarding, or rebound.  No hepato-splenomegaly or mass.  Bowel sounds were normally active.  No hernia. Back:  No CVA tenderness.  Skin:  Clear, warm and dry.  Labs:   Results for orders placed during the hospital encounter of 08/09/11  POCT URINALYSIS DIP (DEVICE)      Component Value Range   Glucose, UA NEGATIVE  NEGATIVE (mg/dL)   Bilirubin Urine NEGATIVE  NEGATIVE    Ketones, ur NEGATIVE  NEGATIVE (mg/dL)   Specific Gravity, Urine >=1.030  1.005 - 1.030    Hgb urine dipstick NEGATIVE  NEGATIVE    pH 5.0  5.0 - 8.0    Protein, ur NEGATIVE  NEGATIVE (mg/dL)   Urobilinogen, UA 0.2   0.0 - 1.0 (mg/dL)   Nitrite NEGATIVE  NEGATIVE    Leukocytes, UA NEGATIVE  NEGATIVE     Other Labs Obtained at Urgent Care Center:  A urine culture was obtained.  Results are pending at this time and we will call about any positive results.  Assessment: The encounter diagnosis was Acute cystitis.   Plan:   1.  The following meds were prescribed:   New Prescriptions   SULFAMETHOXAZOLE-TRIMETHOPRIM (BACTRIM DS) 800-160 MG PER TABLET    Take 2 tablets by mouth once.   2.  The patient was instructed in symptomatic care and handouts were given. 3.  The patient was told to return if becoming worse in any way, if no better in 3 or 4 days, and given some red flag symptoms that would indicate earlier return. 4.  The patient was told to avoid intercourse for 10 days, get extra fluids, and return for a follow up with her primary care doctor at the completion of treatment for a repeat UA and culture.     Reuben Likes, MD 08/09/11 [phone]

## 2011-08-09 NOTE — ED Notes (Signed)
Pt states she has a UTI - has had several in the past- C/o of pain with urination-

## 2011-08-09 NOTE — Discharge Instructions (Signed)

## 2011-08-12 LAB — URINE CULTURE

## 2011-08-13 NOTE — ED Notes (Signed)
Urine culture: >100,000 colonies E. Coli. Pt. adequately treated with Bactrim DS. Colleen Mccullough 08/13/2011

## 2011-10-23 ENCOUNTER — Encounter (HOSPITAL_COMMUNITY): Payer: Self-pay | Admitting: Emergency Medicine

## 2011-10-23 ENCOUNTER — Emergency Department (HOSPITAL_COMMUNITY): Payer: Self-pay

## 2011-10-23 ENCOUNTER — Emergency Department (HOSPITAL_COMMUNITY)
Admission: EM | Admit: 2011-10-23 | Discharge: 2011-10-23 | Disposition: A | Discharge status: Home / Self Care | Payer: Self-pay | Attending: Emergency Medicine | Admitting: Emergency Medicine

## 2011-10-23 DIAGNOSIS — M25569 Pain in unspecified knee: Secondary | ICD-10-CM

## 2011-10-23 DIAGNOSIS — F988 Other specified behavioral and emotional disorders with onset usually occurring in childhood and adolescence: Secondary | ICD-10-CM | POA: Insufficient documentation

## 2011-10-23 DIAGNOSIS — F319 Bipolar disorder, unspecified: Secondary | ICD-10-CM | POA: Insufficient documentation

## 2011-10-23 DIAGNOSIS — J45909 Unspecified asthma, uncomplicated: Secondary | ICD-10-CM | POA: Insufficient documentation

## 2011-10-23 MED ORDER — HYDROCODONE-ACETAMINOPHEN 5-325 MG PO TABS: 1.0000 | ORAL_TABLET | Freq: Once | ORAL | Status: DC

## 2011-10-23 MED ORDER — IBUPROFEN 200 MG PO TABS
600.0000 mg | ORAL_TABLET | Freq: Once | ORAL | Status: AC
  Administered 2011-10-23: 600 mg via ORAL
  Filled 2011-10-23: qty 1

## 2011-10-23 MED ORDER — IBUPROFEN 600 MG PO TABS: 600.0000 mg | ORAL_TABLET | Freq: Three times a day (TID) | ORAL | Status: AC

## 2011-10-23 MED ORDER — HYDROCODONE-ACETAMINOPHEN 5-325 MG PO TABS: ORAL_TABLET | ORAL | Status: AC

## 2011-10-23 MED ORDER — ONDANSETRON 4 MG PO TBDP
8.0000 mg | ORAL_TABLET | Freq: Once | ORAL | Status: AC
  Administered 2011-10-23: 8 mg via ORAL
  Filled 2011-10-23: qty 2

## 2011-10-23 MED ORDER — ONDANSETRON 8 MG PO TBDP: 8.0000 mg | ORAL_TABLET | Freq: Two times a day (BID) | ORAL | Status: AC | PRN

## 2011-10-23 NOTE — ED Notes (Signed)
Pt d/c home in NAD. Pt voiced understanding of d/c instructions and follow up care. Pt instructed not to drive after taking prescriptions.

## 2011-10-23 NOTE — ED Notes (Signed)
R knee pain x 3 hours.  No known injury.

## 2011-10-23 NOTE — Discharge Instructions (Signed)
 Knee Pain The knee is the complex joint between your thigh and your lower leg. It is made up of bones, tendons, ligaments, and cartilage. The bones that make up the knee are:  The femur in the thigh.   The tibia and fibula in the lower leg.   The patella or kneecap riding in the groove on the lower femur.  CAUSES  Knee pain is a common complaint with many causes. A few of these causes are:  Injury, such as:   A ruptured ligament or tendon injury.   Torn cartilage.   Medical conditions, such as:   Gout   Arthritis   Infections   Overuse, over training or overdoing a physical activity.  Knee pain can be minor or severe. Knee pain can accompany debilitating injury. Minor knee problems often respond well to self-care measures or get well on their own. More serious injuries may need medical intervention or even surgery. SYMPTOMS The knee is complex. Symptoms of knee problems can vary widely. Some of the problems are:  Pain with movement and weight bearing.   Swelling and tenderness.   Buckling of the knee.   Inability to straighten or extend your knee.   Your knee locks and you cannot straighten it.   Warmth and redness with pain and fever.   Deformity or dislocation of the kneecap.  DIAGNOSIS  Determining what is wrong may be very straight forward such as when there is an injury. It can also be challenging because of the complexity of the knee. Tests to make a diagnosis may include:  Your caregiver taking a history and doing a physical exam.   Routine X-rays can be used to rule out other problems. X-rays will not reveal a cartilage tear. Some injuries of the knee can be diagnosed by:   Arthroscopy a surgical technique by which a small video camera is inserted through tiny incisions on the sides of the knee. This procedure is used to examine and repair internal knee joint problems. Tiny instruments can be used during arthroscopy to repair the torn knee cartilage  (meniscus).   Arthrography is a radiology technique. A contrast liquid is directly injected into the knee joint. Internal structures of the knee joint then become visible on X-ray film.   An MRI scan is a non x-ray radiology procedure in which magnetic fields and a computer produce two- or three-dimensional images of the inside of the knee. Cartilage tears are often visible using an MRI scanner. MRI scans have largely replaced arthrography in diagnosing cartilage tears of the knee.   Blood work.   Examination of the fluid that helps to lubricate the knee joint (synovial fluid). This is done by taking a sample out using a needle and a syringe.  TREATMENT The treatment of knee problems depends on the cause. Some of these treatments are:  Depending on the injury, proper casting, splinting, surgery or physical therapy care will be needed.   Give yourself adequate recovery time. Do not overuse your joints. If you begin to get sore during workout routines, back off. Slow down or do fewer repetitions.   For repetitive activities such as cycling or running, maintain your strength and nutrition.   Alternate muscle groups. For example if you are a weight lifter, work the upper body on one day and the lower body the next.   Either tight or weak muscles do not give the proper support for your knee. Tight or weak muscles do not absorb the stress placed  on the knee joint. Keep the muscles surrounding the knee strong.   Take care of mechanical problems.   If you have flat feet, orthotics or special shoes may help. See your caregiver if you need help.   Arch supports, sometimes with wedges on the inner or outer aspect of the heel, can help. These can shift pressure away from the side of the knee most bothered by osteoarthritis.   A brace called an unloader brace also may be used to help ease the pressure on the most arthritic side of the knee.   If your caregiver has prescribed crutches, braces,  wraps or ice, use as directed. The acronym for this is PRICE. This means protection, rest, ice, compression and elevation.   Nonsteroidal anti-inflammatory drugs (NSAID's), can help relieve pain. But if taken immediately after an injury, they may actually increase swelling. Take NSAID's with food in your stomach. Stop them if you develop stomach problems. Do not take these if you have a history of ulcers, stomach pain or bleeding from the bowel. Do not take without your caregiver's approval if you have problems with fluid retention, heart failure, or kidney problems.   For ongoing knee problems, physical therapy may be helpful.   Glucosamine and chondroitin are over-the-counter dietary supplements. Both may help relieve the pain of osteoarthritis in the knee. These medicines are different from the usual anti-inflammatory drugs. Glucosamine may decrease the rate of cartilage destruction.   Injections of a corticosteroid drug into your knee joint may help reduce the symptoms of an arthritis flare-up. They may provide pain relief that lasts a few months. You may have to wait a few months between injections. The injections do have a small increased risk of infection, water retention and elevated blood sugar levels.   Hyaluronic acid injected into damaged joints may ease pain and provide lubrication. These injections may work by reducing inflammation. A series of shots may give relief for as long as 6 months.   Topical painkillers. Applying certain ointments to your skin may help relieve the pain and stiffness of osteoarthritis. Ask your pharmacist for suggestions. Many over the-counter products are approved for temporary relief of arthritis pain.   In some countries, doctors often prescribe topical NSAID's for relief of chronic conditions such as arthritis and tendinitis. A review of treatment with NSAID creams found that they worked as well as oral medications but without the serious side effects.    PREVENTION  Maintain a healthy weight. Extra pounds put more strain on your joints.   Get strong, stay limber. Weak muscles are a common cause of knee injuries. Stretching is important. Include flexibility exercises in your workouts.   Be smart about exercise. If you have osteoarthritis, chronic knee pain or recurring injuries, you may need to change the way you exercise. This does not mean you have to stop being active. If your knees ache after jogging or playing basketball, consider switching to swimming, water aerobics or other low-impact activities, at least for a few days a week. Sometimes limiting high-impact activities will provide relief.   Make sure your shoes fit well. Choose footwear that is right for your sport.   Protect your knees. Use the proper gear for knee-sensitive activities. Use kneepads when playing volleyball or laying carpet. Buckle your seat belt every time you drive. Most shattered kneecaps occur in car accidents.   Rest when you are tired.  SEEK MEDICAL CARE IF:  You have knee pain that is continual and does not  seem to be getting better.  SEEK IMMEDIATE MEDICAL CARE IF:  Your knee joint feels hot to the touch and you have a high fever. MAKE SURE YOU:   Understand these instructions.   Will watch your condition.   Will get help right away if you are not doing well or get worse.  Document Released: 12/23/2006 Document Revised: 02/14/2011 Document Reviewed: 12/23/2006 [employer] Patient Information 2012 [place], [place].   Narcotic and benzodiazepine use may cause drowsiness, slowed breathing or dependence.  Please use with caution and do not drive, operate machinery or watch young children alone while taking them.  Taking combinations of these medications or drinking alcohol will potentiate these effects.   Your xrays were normal.  Wear brace, use ibuprofen  with food for 5 days, keep knee elevated, follow up with your own physician or orthopedist as needed.

## 2011-10-23 NOTE — ED Notes (Signed)
Pt c/o "shooting pain from knee cap up leg." Pain does not radiate far.No swelling or deformity noted. Denies any injury

## 2011-10-23 NOTE — Progress Notes (Signed)
Orthopedic Tech Progress Note Patient Details:  Colleen Mccullough 1970-08-10 [id number]  Ortho Devices Type of Ortho Device: Knee Sleeve Ortho Device/Splint Location: right knee Ortho Device/Splint Interventions: Application   Nikki Dom 10/23/2011, 7:32 PM

## 2011-10-23 NOTE — ED Provider Notes (Signed)
History   This chart was scribed for Colleen Mccullough. Oletta Lamas, MD by Charolett Bumpers . The patient was seen in room TR10C/TR10C. Patient's care was started at 1820    CSN: [id number]  Arrival date & time 10/23/11  1618   None     Chief Complaint  Patient presents with  . Knee Pain    (Consider location/radiation/quality/duration/timing/severity/associated sxs/prior treatment) HPI Colleen Mccullough is a 41 y.o. female who presents to the Emergency Department complaining of constant, moderate right knee that started PTA. Pt reports that she was driving when her right knee felt like it went out. Pt reports that the knee pain is a shooting pain that is aggravation with straightening knee and with pressing the gas pedal. Pt reports associated n/v, and has vomited x2 today. Pt reports that she a h/o similar episodes previously that subsided on it's own. Pt denies any known injury.   Past Medical History  Diagnosis Date  . Bipolar 1 disorder   . Hyperglycemia   . Migraines   . Asthma   . Depression   . ADD (attention deficit disorder)     History reviewed. No pertinent past surgical history.  History reviewed. No pertinent family history.  History  Substance Use Topics  . Smoking status: Not on file  . Smokeless tobacco: Not on file  . Alcohol Use: No    OB History    Grav Para Term Preterm Abortions TAB SAB Ect Mult Living                  Review of Systems  Constitutional: Negative for fever and chills.  Respiratory: Negative for shortness of breath.   Gastrointestinal: Negative for nausea and vomiting.  Musculoskeletal: Positive for arthralgias. Negative for joint swelling and gait problem.  Skin: Negative for color change, rash and wound.  Neurological: Negative for weakness and numbness.    Allergies  Amoxicillin; Darvocet; Latex; Percocet; and Promethazine hcl  Home Medications   Current Outpatient Rx  Name Route Sig Dispense Refill  . GABAPENTIN 100 MG  PO CAPS Oral Take 100 mg by mouth 3 (three) times daily as needed. For bipolar/anxiety.    Marland Kitchen HYDROCODONE-ACETAMINOPHEN 5-325 MG PO TABS  1-2 tablets po q 6 hours prn moderate to severe pain 20 tablet 0  . IBUPROFEN 600 MG PO TABS Oral Take 1 tablet (600 mg total) by mouth 3 (three) times daily with meals. 15 tablet 0    BP 96/65  Pulse 101  Temp 98.2 F (36.8 C) (Oral)  Resp 16  SpO2 100%  LMP 10/02/2011  Physical Exam  Nursing note and vitals reviewed. Constitutional: She is oriented to person, place, and time. She appears well-developed and well-nourished. No distress.  HENT:  Head: Normocephalic and atraumatic.  Eyes: EOM are normal.  Neck: Neck supple. No tracheal deviation present.  Cardiovascular: Normal rate.   Pulmonary/Chest: Effort normal. No respiratory distress.  Musculoskeletal: She exhibits tenderness. She exhibits no edema.       Tenderness inferior patella on right leg. No effusions or deformities. Couldn't straighten leg completely or flex past 90 degrees due to pain. Distally neurovascularly intact. Negative Homan's sign. No skin discoloration or rash  Neurological: She is alert and oriented to person, place, and time.  Skin: Skin is warm and dry.       No erythema, induration, or skin changes noted.   Psychiatric: She has a normal mood and affect. Her behavior is normal.    ED Course  Procedures (including critical care time)  DIAGNOSTIC STUDIES: Oxygen Saturation is 100% on room air, normal by my interpretation.    COORDINATION OF CARE:  18:28-Discussed planned course of treatment with the patient, who is agreeable at this time.   18:30-Medication Orders: Ondansetron (Zofran-ODT) disintegrating tablet 8 mg-once  18:45-Medication Orders: Ibuprofen (Advil, Motrin) tablet 600 mg-once.   Labs Reviewed - No data to display Dg Knee Complete 4 Views Right  10/23/2011  *RADIOLOGY REPORT*  Clinical Data: Pain without trauma.  RIGHT KNEE - COMPLETE 4+ VIEW   Comparison: None.  Findings: No effusion. Negative for fracture, dislocation, or other acute abnormality.  Normal alignment and mineralization. No significant degenerative change.  Regional soft tissues unremarkable.  IMPRESSION:  Negative  Original Report Authenticated By: Osa Craver, M.D.     1. Knee pain       MDM  I personally performed the services described in this documentation, which was scribed in my presence. The recorded information has been reviewed and considered.   No trauma.  No symptoms or signs of DVT, no erythema to suggest infection, unlikely to be gout.  Will give brace, analgesics, and RICE at home.  Plain films normal per radiologist.         Colleen Mccullough. Oletta Lamas, MD 10/23/11 1925

## 2012-01-15 ENCOUNTER — Emergency Department (HOSPITAL_COMMUNITY)
Admission: EM | Admit: 2012-01-15 | Discharge: 2012-01-15 | Disposition: A | Discharge status: Home / Self Care | Payer: Self-pay | Attending: Emergency Medicine | Admitting: Emergency Medicine

## 2012-01-15 DIAGNOSIS — J45909 Unspecified asthma, uncomplicated: Secondary | ICD-10-CM | POA: Insufficient documentation

## 2012-01-15 DIAGNOSIS — Z79899 Other long term (current) drug therapy: Secondary | ICD-10-CM | POA: Insufficient documentation

## 2012-01-15 DIAGNOSIS — F329 Major depressive disorder, single episode, unspecified: Secondary | ICD-10-CM | POA: Insufficient documentation

## 2012-01-15 DIAGNOSIS — M25569 Pain in unspecified knee: Secondary | ICD-10-CM | POA: Insufficient documentation

## 2012-01-15 DIAGNOSIS — Z8669 Personal history of other diseases of the nervous system and sense organs: Secondary | ICD-10-CM | POA: Insufficient documentation

## 2012-01-15 DIAGNOSIS — F3289 Other specified depressive episodes: Secondary | ICD-10-CM | POA: Insufficient documentation

## 2012-01-15 DIAGNOSIS — M25469 Effusion, unspecified knee: Secondary | ICD-10-CM | POA: Insufficient documentation

## 2012-01-15 DIAGNOSIS — M25561 Pain in right knee: Secondary | ICD-10-CM

## 2012-01-15 DIAGNOSIS — F988 Other specified behavioral and emotional disorders with onset usually occurring in childhood and adolescence: Secondary | ICD-10-CM | POA: Insufficient documentation

## 2012-01-15 MED ORDER — IBUPROFEN 800 MG PO TABS
800.0000 mg | ORAL_TABLET | Freq: Once | ORAL | Status: AC
  Administered 2012-01-15: 800 mg via ORAL
  Filled 2012-01-15: qty 1

## 2012-01-15 NOTE — ED Provider Notes (Signed)
History     CSN: [id number]  Arrival date & time 01/15/12  1939   None     Chief Complaint  Patient presents with  . Knee Pain    (Consider location/radiation/quality/duration/timing/severity/associated sxs/prior treatment) Patient is a 41 y.o. female presenting with knee pain. The history is provided by the patient and a relative. No language interpreter was used.  Knee Pain This is a recurrent problem. The current episode started in the past 7 days. The problem occurs constantly. The problem has been gradually worsening. Associated symptoms include arthralgias and joint swelling. Pertinent negatives include no fever, nausea, vomiting or weakness. The symptoms are aggravated by walking. She has tried nothing for the symptoms.  Recurrent R knee pain.  X-ray unremarkable in September. Denies injury. Limping states this pain has been recurring for years. Swelling noted. No deformity.   Past Medical History  Diagnosis Date  . Bipolar 1 disorder   . Hyperglycemia   . Migraines   . Asthma   . Depression   . ADD (attention deficit disorder)     No past surgical history on file.  No family history on file.  History  Substance Use Topics  . Smoking status: Not on file  . Smokeless tobacco: Not on file  . Alcohol Use: No    OB History    Grav Para Term Preterm Abortions TAB SAB Ect Mult Living                  Review of Systems  Constitutional: Negative.  Negative for fever.  HENT: Negative.   Eyes: Negative.   Respiratory: Negative.   Cardiovascular: Negative.   Gastrointestinal: Negative.  Negative for nausea and vomiting.  Musculoskeletal: Positive for joint swelling, arthralgias and gait problem. Negative for back pain.  Neurological: Negative.  Negative for weakness.  Psychiatric/Behavioral: Negative.   All other systems reviewed and are negative.    Allergies  Amoxicillin; Darvocet; Latex; Percocet; and Promethazine hcl  Home Medications   Current  Outpatient Rx  Name  Route  Sig  Dispense  Refill  . PAXIL PO   Oral   Take 1 tablet by mouth daily.         Marland Kitchen RASPBERRY PO   Oral   Take 1 tablet by mouth daily.           BP 98/58  Pulse 90  Temp 98.1 F (36.7 C)  Resp 16  SpO2 98%  Physical Exam  Nursing note and vitals reviewed. Constitutional: She is oriented to person, place, and time. She appears well-developed and well-nourished.  HENT:  Head: Normocephalic and atraumatic.  Eyes: Conjunctivae normal and EOM are normal. Pupils are equal, round, and reactive to light.  Neck: Normal range of motion. Neck supple.  Cardiovascular: Normal rate.   Pulmonary/Chest: Effort normal.  Abdominal: Soft.  Musculoskeletal: Normal range of motion. She exhibits edema and tenderness.       Positive CMS below knee pain. No deformity noted. Cool to touch  Neurological: She is alert and oriented to person, place, and time. She has normal reflexes.  Skin: Skin is warm and dry.  Psychiatric: She has a normal mood and affect.    ED Course  Procedures (including critical care time)  Labs Reviewed - No data to display No results found.   No diagnosis found.    MDM  Recurrent R knee pain with no injury.  Recent xray unremarkable.  Referred to orthopedics for follow up.  RICE.  Ibuprofen  for pain.  Knee immobilizer provided.        Remi Haggard, NP 01/16/12 1346

## 2012-01-15 NOTE — ED Notes (Signed)
Pt c/o R knee pain "that just started on the way here." Pt ambulatory to room in wheelchair.

## 2012-01-15 NOTE — ED Notes (Signed)
Ortho tech at bedside 

## 2012-01-18 NOTE — ED Provider Notes (Signed)
Medical screening examination/treatment/procedure(s) were performed by non-physician practitioner and as supervising physician I was immediately available for consultation/collaboration.  Doug Sou, MD 01/18/12 [phone]

## 2012-02-16 ENCOUNTER — Emergency Department (HOSPITAL_COMMUNITY)
Admission: EM | Admit: 2012-02-16 | Discharge: 2012-02-16 | Disposition: A | Discharge status: Home / Self Care | Payer: Self-pay | Attending: Emergency Medicine | Admitting: Emergency Medicine

## 2012-02-16 ENCOUNTER — Encounter (HOSPITAL_COMMUNITY): Payer: Self-pay

## 2012-02-16 DIAGNOSIS — F3289 Other specified depressive episodes: Secondary | ICD-10-CM | POA: Insufficient documentation

## 2012-02-16 DIAGNOSIS — Z862 Personal history of diseases of the blood and blood-forming organs and certain disorders involving the immune mechanism: Secondary | ICD-10-CM | POA: Insufficient documentation

## 2012-02-16 DIAGNOSIS — Z8639 Personal history of other endocrine, nutritional and metabolic disease: Secondary | ICD-10-CM | POA: Insufficient documentation

## 2012-02-16 DIAGNOSIS — R1110 Vomiting, unspecified: Secondary | ICD-10-CM | POA: Insufficient documentation

## 2012-02-16 DIAGNOSIS — J40 Bronchitis, not specified as acute or chronic: Secondary | ICD-10-CM | POA: Insufficient documentation

## 2012-02-16 DIAGNOSIS — Z8659 Personal history of other mental and behavioral disorders: Secondary | ICD-10-CM | POA: Insufficient documentation

## 2012-02-16 DIAGNOSIS — Z8669 Personal history of other diseases of the nervous system and sense organs: Secondary | ICD-10-CM | POA: Insufficient documentation

## 2012-02-16 DIAGNOSIS — J45909 Unspecified asthma, uncomplicated: Secondary | ICD-10-CM | POA: Insufficient documentation

## 2012-02-16 DIAGNOSIS — R079 Chest pain, unspecified: Secondary | ICD-10-CM | POA: Insufficient documentation

## 2012-02-16 DIAGNOSIS — F988 Other specified behavioral and emotional disorders with onset usually occurring in childhood and adolescence: Secondary | ICD-10-CM | POA: Insufficient documentation

## 2012-02-16 DIAGNOSIS — F329 Major depressive disorder, single episode, unspecified: Secondary | ICD-10-CM | POA: Insufficient documentation

## 2012-02-16 MED ORDER — DEXAMETHASONE SODIUM PHOSPHATE 10 MG/ML IJ SOLN
10.0000 mg | Freq: Once | INTRAMUSCULAR | Status: AC
  Administered 2012-02-16: 10 mg via INTRAMUSCULAR
  Filled 2012-02-16: qty 1

## 2012-02-16 NOTE — ED Provider Notes (Signed)
History     CSN: [id number]  Arrival date & time 02/16/12  1151   First MD Initiated Contact with Patient 02/16/12 1220      Chief Complaint  Patient presents with  . Emesis  . Cough  . Chest Pain     HPI Patient has reports of a nonproductive cough for the last several weeks.  Patient has had cough-induced emesis and a small amount of diarrhea.  Patient also has chest pain associated with the coughing for the last week.  Patient lives with a cigarette smoker but has not actually smoker self.  She does have a history of asthma.  Patient will not use and an inhaler.  She has noticed wheezing and has tried to use a over-the-counter medication for that. Past Medical History  Diagnosis Date  . Bipolar 1 disorder   . Hyperglycemia   . Migraines   . Asthma   . Depression   . ADD (attention deficit disorder)     Past Surgical History  Procedure Date  . Tubal ligation   . Cesarean section     Family History  Problem Relation Age of Onset  . Hypertension Father     History  Substance Use Topics  . Smoking status: Never Smoker   . Smokeless tobacco: Never Used  . Alcohol Use: No    OB History    Grav Para Term Preterm Abortions TAB SAB Ect Mult Living                  Review of Systems All other systems reviewed and are negative Allergies  Amoxicillin; Darvocet; Latex; Percocet; and Promethazine hcl  Home Medications   Current Outpatient Rx  Name  Route  Sig  Dispense  Refill  . OVER THE COUNTER MEDICATION   Oral   Take 1 spray by mouth as needed. Over the counter spray from [employer] for wheezing         . RASPBERRY PO   Oral   Take 1 tablet by mouth daily.           BP 111/70  Pulse 65  Temp 98.4 F (36.9 C)  Resp 18  SpO2 99%  LMP 01/31/2012  Physical Exam  Nursing note and vitals reviewed. Constitutional: She is oriented to person, place, and time. She appears well-developed and well-nourished. No distress.  HENT:  Head: Normocephalic  and atraumatic.  Eyes: Pupils are equal, round, and reactive to light.  Neck: Normal range of motion.  Cardiovascular: Normal rate and intact distal pulses.   Pulmonary/Chest: No respiratory distress. She has wheezes (Mild expiratory).  Abdominal: Normal appearance. She exhibits no distension.  Musculoskeletal: Normal range of motion.  Neurological: She is alert and oriented to person, place, and time. No cranial nerve deficit.  Skin: Skin is warm and dry. No rash noted.  Psychiatric: She has a normal mood and affect. Her behavior is normal.    ED Course  Procedures (including critical care time)  Date: 02/16/2012  Rate: 98  Rhythm: normal sinus rhythm  QRS Axis: normal  Intervals: normal  ST/T Wave abnormalities: normal  Conduction Disutrbances: none  Narrative Interpretation: unremarkable    Medications  OVER THE COUNTER MEDICATION (not administered)  dexamethasone (DECADRON) injection 10 mg (10 mg Intramuscular Given 02/16/12 1236)     Labs Reviewed - No data to display No results found.   1. Bronchitis       MDM         Doris Cheadle  Radford Pax, MD 02/16/12 1244

## 2012-02-16 NOTE — ED Notes (Signed)
MD at bedside. 

## 2012-02-16 NOTE — ED Notes (Signed)
Patient reports that she has been having a non productive cough, N/V, small amounts of diarrhea, and chest pain that is worse with coughing x 1 week.

## 2012-03-12 ENCOUNTER — Encounter (HOSPITAL_COMMUNITY): Payer: Self-pay | Admitting: *Deleted

## 2012-03-12 ENCOUNTER — Emergency Department (HOSPITAL_COMMUNITY)
Admission: EM | Admit: 2012-03-12 | Discharge: 2012-03-13 | Disposition: A | Discharge status: Home / Self Care | Payer: Self-pay | Attending: Emergency Medicine | Admitting: Emergency Medicine

## 2012-03-12 ENCOUNTER — Emergency Department (HOSPITAL_COMMUNITY): Payer: Self-pay

## 2012-03-12 DIAGNOSIS — Z8659 Personal history of other mental and behavioral disorders: Secondary | ICD-10-CM | POA: Insufficient documentation

## 2012-03-12 DIAGNOSIS — R7309 Other abnormal glucose: Secondary | ICD-10-CM | POA: Insufficient documentation

## 2012-03-12 DIAGNOSIS — F319 Bipolar disorder, unspecified: Secondary | ICD-10-CM | POA: Insufficient documentation

## 2012-03-12 DIAGNOSIS — Z79899 Other long term (current) drug therapy: Secondary | ICD-10-CM | POA: Insufficient documentation

## 2012-03-12 DIAGNOSIS — R509 Fever, unspecified: Secondary | ICD-10-CM | POA: Insufficient documentation

## 2012-03-12 DIAGNOSIS — J3489 Other specified disorders of nose and nasal sinuses: Secondary | ICD-10-CM | POA: Insufficient documentation

## 2012-03-12 DIAGNOSIS — F988 Other specified behavioral and emotional disorders with onset usually occurring in childhood and adolescence: Secondary | ICD-10-CM | POA: Insufficient documentation

## 2012-03-12 DIAGNOSIS — J029 Acute pharyngitis, unspecified: Secondary | ICD-10-CM | POA: Insufficient documentation

## 2012-03-12 DIAGNOSIS — IMO0001 Reserved for inherently not codable concepts without codable children: Secondary | ICD-10-CM | POA: Insufficient documentation

## 2012-03-12 DIAGNOSIS — H9209 Otalgia, unspecified ear: Secondary | ICD-10-CM | POA: Insufficient documentation

## 2012-03-12 DIAGNOSIS — J111 Influenza due to unidentified influenza virus with other respiratory manifestations: Secondary | ICD-10-CM | POA: Insufficient documentation

## 2012-03-12 DIAGNOSIS — Z8679 Personal history of other diseases of the circulatory system: Secondary | ICD-10-CM | POA: Insufficient documentation

## 2012-03-12 DIAGNOSIS — J45909 Unspecified asthma, uncomplicated: Secondary | ICD-10-CM | POA: Insufficient documentation

## 2012-03-12 DIAGNOSIS — M549 Dorsalgia, unspecified: Secondary | ICD-10-CM | POA: Insufficient documentation

## 2012-03-12 MED ORDER — KETOROLAC TROMETHAMINE 60 MG/2ML IM SOLN: 60.0000 mg | Freq: Once | INTRAMUSCULAR | Status: DC

## 2012-03-12 NOTE — ED Provider Notes (Signed)
History   This chart was scribed for non-physician practitioner working with Colleen Booze, MD by Frederik Pear, ED Scribe. This patient was seen in room PED08/PED08 and the patient's care was started at 2248.   CSN: [id number]  Arrival date & time 03/12/12  2217   First MD Initiated Contact with Patient 03/12/12 2248      Chief Complaint  Patient presents with  . Cough  . Back Pain  . Nasal Congestion    (Consider location/radiation/quality/duration/timing/severity/associated sxs/prior treatment) Patient is a 42 y.o. female presenting with URI. The history is provided by the patient.  URI The primary symptoms include fever, ear pain, sore throat, cough and myalgias. The current episode started 3 to 5 days ago. This is a new problem.  The fever began 3 to 5 days ago. The fever has been unchanged since its onset.  Myalgias began 3 to 5 days ago. The myalgias are generalized. Risk factors: No risk factors.  The onset of the illness is associated with exposure to sick contacts. Symptoms associated with the illness include rhinorrhea.    Colleen Mccullough is a 42 y.o. female who presents to the Emergency Department complaining of a intermittent, moderate cough with associated intermittent ear pain, fever, myalgias, lower back pain, rhinorrhea, and sore throat that began 3 days ago. She states that she has been taking Mucinex without relief. She denies any changes to urinary frequency or urgency. She is allergic to percocet and amoxicillin.   Past Medical History  Diagnosis Date  . Bipolar 1 disorder   . Hyperglycemia   . Migraines   . Asthma   . Depression   . ADD (attention deficit disorder)     Past Surgical History  Procedure Date  . Tubal ligation   . Cesarean section     Family History  Problem Relation Age of Onset  . Hypertension Father     History  Substance Use Topics  . Smoking status: Never Smoker   . Smokeless tobacco: Never Used  . Alcohol Use: No    OB  History    Grav Para Term Preterm Abortions TAB SAB Ect Mult Living                  Review of Systems  Constitutional: Positive for fever.  HENT: Positive for ear pain, sore throat and rhinorrhea.   Respiratory: Positive for cough.   Musculoskeletal: Positive for myalgias and back pain.  All other systems reviewed and are negative.    Allergies  Amoxicillin; Darvocet; Latex; Percocet; and Promethazine hcl  Home Medications   Current Outpatient Rx  Name  Route  Sig  Dispense  Refill  . GUAIFENESIN ER 600 MG PO TB12   Oral   Take 1,200 mg by mouth 2 (two) times daily.           BP 119/66  Pulse 122  Temp 99.3 F (37.4 C) (Oral)  Resp 17  SpO2 100%  LMP 02/29/2012  Physical Exam  Nursing note and vitals reviewed. Constitutional: She is oriented to person, place, and time. She appears well-developed and well-nourished.  HENT:  Head: Normocephalic and atraumatic.  Mouth/Throat: No oropharyngeal exudate.  Eyes: Conjunctivae normal are normal. Pupils are equal, round, and reactive to light.  Neck: Normal range of motion. Neck supple.  Cardiovascular: Normal rate, regular rhythm and normal heart sounds.   Pulmonary/Chest: Effort normal. She has wheezes. She exhibits tenderness.  Abdominal: Soft. Bowel sounds are normal.  Musculoskeletal: Normal range of  motion. She exhibits no edema and no tenderness.       Back:  Lymphadenopathy:    She has cervical adenopathy.  Neurological: She is alert and oriented to person, place, and time.  Skin: Skin is warm and dry.  Psychiatric: She has a normal mood and affect. Her behavior is normal. Judgment and thought content normal.    ED Course  Procedures (including critical care time)  DIAGNOSTIC STUDIES: Oxygen Saturation is 100% on room air, normal by my interpretation.    COORDINATION OF CARE:  23:15- Discussed planned course of treatment with the patient, including a chest X-ray, who is agreeable at this  time.   Labs Reviewed - No data to display No results found.   No diagnosis found. 12:22 AM Lab results pending.  Report provided to Ivonne Andrew, PA who will follow-up on results and disposition.  MDM    I personally performed the services described in this documentation, which was scribed in my presence. The recorded information has been reviewed and is accurate.       Jimmye Norman, NP 03/13/12 [phone]

## 2012-03-12 NOTE — ED Notes (Signed)
Pt has been sick for 3 days with cough, lower back pain.  She feels like she has bronchitis.  She has been taking mucinex without relief.  Pt is also c/o throat pain.  No dysuria.  She also has right ear pain.  Pt is requesting meds from the $4 list to help with the cough.

## 2012-03-13 LAB — URINALYSIS, ROUTINE W REFLEX MICROSCOPIC
Bilirubin Urine: NEGATIVE
Glucose, UA: NEGATIVE mg/dL
Leukocytes, UA: NEGATIVE
Nitrite: NEGATIVE
Urobilinogen, UA: 0.2 mg/dL (ref 0.0–1.0)
pH: 6.5 (ref 5.0–8.0)

## 2012-03-13 MED ORDER — BENZONATATE 100 MG PO CAPS: 100.0000 mg | ORAL_CAPSULE | Freq: Three times a day (TID) | ORAL | Status: DC

## 2012-03-13 MED ORDER — KETOROLAC TROMETHAMINE 30 MG/ML IJ SOLN
INTRAMUSCULAR | Status: AC
  Administered 2012-03-13: 60 mg
  Filled 2012-03-13: qty 2

## 2012-03-13 NOTE — ED Provider Notes (Signed)
Medical screening examination/treatment/procedure(s) were performed by non-physician practitioner and as supervising physician I was immediately available for consultation/collaboration.  Nevia Henkin R. Rozina Pointer, MD 03/13/12 0737 

## 2012-03-13 NOTE — ED Provider Notes (Signed)
Medical screening examination/treatment/procedure(s) were performed by non-physician practitioner and as supervising physician I was immediately available for consultation/collaboration.   Dione Booze, MD 03/13/12 [phone]

## 2012-03-13 NOTE — ED Provider Notes (Signed)
Colleen Mccullough S 12:00 AM patient discussed in sign out with Felicie Morn NP. Patient with cough, myalgias and URI-type symptoms. Chest x-ray unremarkable no signs of pneumonia. Patient has slight flank pain and discomfort UA pending to rule out UTI.  Patient with mild tachycardia but tolerating by mouth fluids. Does not appear dehydrated. UA unremarkable.  Symptoms consistent with possible flulike syndrome. Symptoms have been greater than 48 hours. At this time we'll discharge with symptomatic treatment.  Angus Seller, PA 03/13/12 0236

## 2012-07-13 ENCOUNTER — Encounter (HOSPITAL_COMMUNITY): Payer: Self-pay | Admitting: Emergency Medicine

## 2012-07-13 ENCOUNTER — Emergency Department (HOSPITAL_COMMUNITY)
Admission: EM | Admit: 2012-07-13 | Discharge: 2012-07-13 | Disposition: A | Discharge status: Home / Self Care | Payer: No Typology Code available for payment source | Attending: Emergency Medicine | Admitting: Emergency Medicine

## 2012-07-13 DIAGNOSIS — J45909 Unspecified asthma, uncomplicated: Secondary | ICD-10-CM | POA: Insufficient documentation

## 2012-07-13 DIAGNOSIS — Z862 Personal history of diseases of the blood and blood-forming organs and certain disorders involving the immune mechanism: Secondary | ICD-10-CM | POA: Insufficient documentation

## 2012-07-13 DIAGNOSIS — F319 Bipolar disorder, unspecified: Secondary | ICD-10-CM | POA: Insufficient documentation

## 2012-07-13 DIAGNOSIS — Z8679 Personal history of other diseases of the circulatory system: Secondary | ICD-10-CM | POA: Insufficient documentation

## 2012-07-13 DIAGNOSIS — K0889 Other specified disorders of teeth and supporting structures: Secondary | ICD-10-CM

## 2012-07-13 DIAGNOSIS — Z79899 Other long term (current) drug therapy: Secondary | ICD-10-CM | POA: Insufficient documentation

## 2012-07-13 DIAGNOSIS — K029 Dental caries, unspecified: Secondary | ICD-10-CM | POA: Insufficient documentation

## 2012-07-13 DIAGNOSIS — Z8639 Personal history of other endocrine, nutritional and metabolic disease: Secondary | ICD-10-CM | POA: Insufficient documentation

## 2012-07-13 DIAGNOSIS — K089 Disorder of teeth and supporting structures, unspecified: Secondary | ICD-10-CM | POA: Insufficient documentation

## 2012-07-13 DIAGNOSIS — F988 Other specified behavioral and emotional disorders with onset usually occurring in childhood and adolescence: Secondary | ICD-10-CM | POA: Insufficient documentation

## 2012-07-13 MED ORDER — TRAMADOL HCL 50 MG PO TABS: 50.0000 mg | ORAL_TABLET | Freq: Four times a day (QID) | ORAL | Status: DC | PRN

## 2012-07-13 MED ORDER — CLINDAMYCIN HCL 150 MG PO CAPS: 300.0000 mg | ORAL_CAPSULE | Freq: Three times a day (TID) | ORAL | Status: DC

## 2012-07-13 NOTE — ED Provider Notes (Signed)
Medical screening examination/treatment/procedure(s) were performed by non-physician practitioner and as supervising physician I was immediately available for consultation/collaboration.  Jones Skene, M.D.     Jones Skene, MD 07/13/12 1610

## 2012-07-13 NOTE — ED Provider Notes (Signed)
History     CSN: [id number]  Arrival date & time 07/13/12  0050   First MD Initiated Contact with Patient 07/13/12 0123      Chief Complaint  Patient presents with  . Dental Pain    (Consider location/radiation/quality/duration/timing/severity/associated sxs/prior treatment) HPI History provided by pt.   Pt presents w/ severe, left upper dental pain x 2 days.  Radiates to left side of face.  No relief w/ aleve or naproxen.  No associated fever.  Does not currently have a dentist.  Past Medical History  Diagnosis Date  . Bipolar 1 disorder   . Hyperglycemia   . Migraines   . Asthma   . Depression   . ADD (attention deficit disorder)     Past Surgical History  Procedure Laterality Date  . Tubal ligation    . Cesarean section      Family History  Problem Relation Age of Onset  . Hypertension Father     History  Substance Use Topics  . Smoking status: Never Smoker   . Smokeless tobacco: Never Used  . Alcohol Use: No    OB History   Grav Para Term Preterm Abortions TAB SAB Ect Mult Living                  Review of Systems  All other systems reviewed and are negative.    Allergies  Amoxicillin; Darvocet; Latex; Percocet; and Promethazine hcl  Home Medications   Current Outpatient Rx  Name  Route  Sig  Dispense  Refill  . ibuprofen (ADVIL,MOTRIN) 200 MG tablet   Oral   Take 600 mg by mouth every 6 (six) hours as needed for pain.         . naproxen sodium (ANAPROX) 220 MG tablet   Oral   Take 440 mg by mouth 2 (two) times daily as needed (for pain).         Marland Kitchen sertraline (ZOLOFT) 50 MG tablet   Oral   Take 50 mg by mouth daily.         . clindamycin (CLEOCIN) 150 MG capsule   Oral   Take 2 capsules (300 mg total) by mouth 3 (three) times daily.   42 capsule   0   . traMADol (ULTRAM) 50 MG tablet   Oral   Take 1 tablet (50 mg total) by mouth every 6 (six) hours as needed for pain.   15 tablet   0     BP 127/87  Pulse 87  Temp(Src)  98.9 F (37.2 C) (Oral)  Resp 16  SpO2 99%  Physical Exam  Nursing note and vitals reviewed. Constitutional: She is oriented to person, place, and time. She appears well-developed and well-nourished.  HENT:  Head: Normocephalic and atraumatic. No trismus in the jaw.  Mouth/Throat: Uvula is midline and oropharynx is clear and moist.  Diffusely poor dentition.  L upper second premolar w/ Rennis Harding 2 fracture at posterior aspect.  Advanced carie.  Ttp.  Adjacent gingiva appears normal.  No edema of buccal mucosa.    Eyes:  Normal appearance  Neck: Normal range of motion. Neck supple.  No submandibular edema  Lymphadenopathy:    She has no cervical adenopathy.  Neurological: She is alert and oriented to person, place, and time.  Psychiatric: She has a normal mood and affect. Her behavior is normal.    ED Course  Procedures (including critical care time)  Labs Reviewed - No data to display No results found.  1. Pain, dental       MDM  41yo F presents w/ dental pain.  Possible periapical abscess.  Prescribed clinda and tramadol and referred to dentist on call.  Return precautions discussed.         Otilio Miu, PA-C 07/13/12 [phone]

## 2012-07-13 NOTE — ED Notes (Signed)
Pt has been having dental pain x2 days. Affected tooth is on left upper side of mouth. Decay is noted on affected tooth. Some facial swelling noted. Pt reports pain radiating from tooth and up left side of face. Pt also reports some drainage from affected tooth.

## 2012-07-13 NOTE — ED Notes (Signed)
Pt took 200mg  of Ibuprofen and Naproxn within the last 4 hours. Pt driving

## 2012-07-14 ENCOUNTER — Emergency Department (HOSPITAL_COMMUNITY)
Admission: EM | Admit: 2012-07-14 | Discharge: 2012-07-14 | Disposition: A | Discharge status: Home / Self Care | Payer: No Typology Code available for payment source | Attending: Emergency Medicine | Admitting: Emergency Medicine

## 2012-07-14 ENCOUNTER — Encounter (HOSPITAL_COMMUNITY): Payer: Self-pay | Admitting: *Deleted

## 2012-07-14 DIAGNOSIS — K047 Periapical abscess without sinus: Secondary | ICD-10-CM | POA: Diagnosis not present

## 2012-07-14 DIAGNOSIS — R220 Localized swelling, mass and lump, head: Secondary | ICD-10-CM | POA: Diagnosis present

## 2012-07-14 DIAGNOSIS — Z8659 Personal history of other mental and behavioral disorders: Secondary | ICD-10-CM | POA: Insufficient documentation

## 2012-07-14 DIAGNOSIS — Z8639 Personal history of other endocrine, nutritional and metabolic disease: Secondary | ICD-10-CM | POA: Insufficient documentation

## 2012-07-14 DIAGNOSIS — Z91040 Latex allergy status: Secondary | ICD-10-CM | POA: Insufficient documentation

## 2012-07-14 DIAGNOSIS — Z862 Personal history of diseases of the blood and blood-forming organs and certain disorders involving the immune mechanism: Secondary | ICD-10-CM | POA: Diagnosis not present

## 2012-07-14 DIAGNOSIS — F319 Bipolar disorder, unspecified: Secondary | ICD-10-CM | POA: Diagnosis not present

## 2012-07-14 DIAGNOSIS — R221 Localized swelling, mass and lump, neck: Secondary | ICD-10-CM | POA: Diagnosis present

## 2012-07-14 DIAGNOSIS — Z79899 Other long term (current) drug therapy: Secondary | ICD-10-CM | POA: Insufficient documentation

## 2012-07-14 DIAGNOSIS — H9209 Otalgia, unspecified ear: Secondary | ICD-10-CM | POA: Insufficient documentation

## 2012-07-14 DIAGNOSIS — Z8679 Personal history of other diseases of the circulatory system: Secondary | ICD-10-CM | POA: Insufficient documentation

## 2012-07-14 DIAGNOSIS — J45909 Unspecified asthma, uncomplicated: Secondary | ICD-10-CM | POA: Insufficient documentation

## 2012-07-14 MED ORDER — BUPIVACAINE-EPINEPHRINE PF 0.25-1:200000 % IJ SOLN
30.0000 mL | Freq: Once | INTRAMUSCULAR | Status: AC
  Administered 2012-07-14: 30 mL
  Filled 2012-07-14: qty 30

## 2012-07-14 MED ORDER — BUPIVACAINE-EPINEPHRINE PF 0.5-1:200000 % IJ SOLN: 10.0000 mL | Freq: Once | INTRAMUSCULAR | Status: DC

## 2012-07-14 NOTE — ED Notes (Signed)
Pt reports put on clindamycin starting yesterday due to infection in teeth. States she took medication yesterday and has more swelling than usual to mouth. No respiratory distress. No rash noted.

## 2012-07-14 NOTE — ED Notes (Signed)
MD at bedside to drain abscess. I & D tray set at bedside.

## 2012-07-14 NOTE — ED Notes (Signed)
MD at bedside. 

## 2012-07-14 NOTE — ED Provider Notes (Signed)
History     CSN: [id number]  Arrival date & time 07/14/12  0810   First MD Initiated Contact with Patient 07/14/12 [phone]      Chief Complaint  Patient presents with  . Allergic Reaction    (Consider location/radiation/quality/duration/timing/severity/associated sxs/prior treatment) HPI Comments: 42 yo female with PMH of dental pain presents to the ED with unilateral left sided facial swelling. Pt states she was seen yesterday for dental pain and was RXed clindamycin and given a dental referral. Pt was unable to see dentist due to child care issues. Patient states she has taken clindamycin previously with no issues. States her first dose was yesterday at 10 am and then swelling began at approximately 11 am. Admits to left sided ear pain. Denies itching, rash, throat pain, difficulty swallowing or breathing, fevers/ chills. No headaches, n/v/f/c.  Patient is a 42 y.o. female presenting with allergic reaction. The history is provided by the patient.  Allergic Reaction The primary symptoms do not include shortness of breath, abdominal pain, nausea or vomiting.    Past Medical History  Diagnosis Date  . Bipolar 1 disorder   . Hyperglycemia   . Migraines   . Asthma   . Depression   . ADD (attention deficit disorder)     Past Surgical History  Procedure Laterality Date  . Tubal ligation    . Cesarean section      Family History  Problem Relation Age of Onset  . Hypertension Father     History  Substance Use Topics  . Smoking status: Never Smoker   . Smokeless tobacco: Never Used  . Alcohol Use: No    OB History   Grav Para Term Preterm Abortions TAB SAB Ect Mult Living                  Review of Systems  Constitutional: Positive for activity change. Negative for fever.  HENT: Positive for ear pain. Negative for sore throat, drooling, trouble swallowing and neck pain.   Respiratory: Negative for shortness of breath.   Cardiovascular: Negative for chest pain.   Gastrointestinal: Negative for nausea, vomiting and abdominal pain.  Genitourinary: Negative for dysuria.  Neurological: Negative for headaches.    Allergies  Amoxicillin; Darvocet; Latex; Percocet; and Promethazine hcl  Home Medications   Current Outpatient Rx  Name  Route  Sig  Dispense  Refill  . clindamycin (CLEOCIN) 150 MG capsule   Oral   Take 2 capsules (300 mg total) by mouth 3 (three) times daily.   42 capsule   0   . ibuprofen (ADVIL,MOTRIN) 200 MG tablet   Oral   Take 600 mg by mouth every 6 (six) hours as needed for pain.         . naproxen sodium (ANAPROX) 220 MG tablet   Oral   Take 440 mg by mouth 2 (two) times daily as needed (for pain).         Marland Kitchen sertraline (ZOLOFT) 50 MG tablet   Oral   Take 50 mg by mouth daily.         . traMADol (ULTRAM) 50 MG tablet   Oral   Take 1 tablet (50 mg total) by mouth every 6 (six) hours as needed for pain.   15 tablet   0     BP 101/59  Pulse 104  Temp(Src) 98 F (36.7 C) (Oral)  Resp 16  SpO2 100%  Physical Exam  Nursing note and vitals reviewed. Constitutional: She is oriented to  person, place, and time. She appears well-developed and well-nourished.  HENT:  Head: Normocephalic and atraumatic.  Overlying teeth 11-13, there is fluctuance and market tenderness. There is no trismus. Poor dentition over the same teeth appreciated. No neck stiffness, no cervical lymphadenopathy.   Eyes: EOM are normal. Pupils are equal, round, and reactive to light.  Neck: Neck supple.  Cardiovascular: Normal rate, regular rhythm and normal heart sounds.   No murmur heard. Pulmonary/Chest: Effort normal. No respiratory distress.  Abdominal: Soft. She exhibits no distension. There is no tenderness. There is no rebound and no guarding.  Neurological: She is alert and oriented to person, place, and time.  Skin: Skin is warm and dry.    ED Course  NERVE BLOCK Date/Time: 07/14/2012 10:08 AM Performed by: Derwood Kaplan Authorized by: Derwood Kaplan Consent: Verbal consent obtained. Risks and benefits: risks, benefits and alternatives were discussed Consent given by: patient Patient understanding: patient states understanding of the procedure being performed Patient identity confirmed: verbally with patient Time out: Immediately prior to procedure a "time out" was called to verify the correct patient, procedure, equipment, support staff and site/side marked as required. Indications: pain relief Body area: face/mouth Nerve: anterior superior alveolar Laterality: left Patient sedated: no Preparation: Patient was prepped and draped in the usual sterile fashion. Patient position: sitting Needle gauge: 25 G Location technique: anatomical landmarks Local anesthetic: bupivacaine 0.5% with epinephrine Anesthetic total: 2 ml Outcome: pain improved Patient tolerance: Patient tolerated the procedure well with no immediate complications.  NERVE BLOCK Date/Time: 07/14/2012 10:09 AM Performed by: Derwood Kaplan Authorized by: Derwood Kaplan Consent: Verbal consent obtained. Risks and benefits: risks, benefits and alternatives were discussed Consent given by: patient Patient understanding: patient states understanding of the procedure being performed Patient identity confirmed: verbally with patient Time out: Immediately prior to procedure a "time out" was called to verify the correct patient, procedure, equipment, support staff and site/side marked as required. Indications: pain relief Body area: face/mouth Nerve: middle superior alveolar Laterality: left Patient sedated: no Preparation: Patient was prepped and draped in the usual sterile fashion. Patient position: sitting Needle gauge: 25 G Location technique: anatomical landmarks Local anesthetic: bupivacaine 0.5% with epinephrine Anesthetic total: 2 ml Outcome: pain improved Patient tolerance: Patient tolerated the procedure well with no  immediate complications.  INCISION AND DRAINAGE Date/Time: 07/14/2012 10:11 AM Performed by: Derwood Kaplan Authorized by: Derwood Kaplan Consent: Verbal consent obtained. Risks and benefits: risks, benefits and alternatives were discussed Consent given by: patient Patient understanding: patient states understanding of the procedure being performed Site marked: the operative site was marked Patient identity confirmed: verbally with patient Time out: Immediately prior to procedure a "time out" was called to verify the correct patient, procedure, equipment, support staff and site/side marked as required. Type: abscess Body area: head/neck Location details: face Anesthesia: nerve block Local anesthetic: bupivacaine 0.5% with epinephrine Anesthetic total: 4 ml Patient sedated: no Risk factor: underlying major vessel and underlying major nerve Scalpel size: 11 Incision type: single straight Complexity: simple Drainage: purulent Drainage amount: moderate Wound treatment: wound left open Patient tolerance: Patient tolerated the procedure well with no immediate complications.   (including critical care time)  Labs Reviewed - No data to display No results found.   No diagnosis found.    MDM  Pt  Comes in with cc of left sided facial swelling. Clinical exam reveals slight swelling of the left side of the face. No trismus. Pt has abscess over the gingiva - drained.  I  advocated the importance of close f/u with Dentist - which she understands.  Constitutionals are all negative - and dont suspect deep infection of the face based on the exam. Return precautions provided. No pain meds to be given.    Derwood Kaplan, MD 07/14/12 1015

## 2012-10-01 ENCOUNTER — Encounter: Payer: Self-pay | Admitting: Family Medicine

## 2012-10-01 ENCOUNTER — Ambulatory Visit (INDEPENDENT_AMBULATORY_CARE_PROVIDER_SITE_OTHER): Payer: PRIVATE HEALTH INSURANCE | Admitting: Family Medicine

## 2012-10-01 VITALS — BP 118/60 | HR 102 | Temp 98.2°F | Resp 18 | Ht 62.0 in | Wt 121.0 lb

## 2012-10-01 DIAGNOSIS — R102 Pelvic and perineal pain unspecified side: Secondary | ICD-10-CM

## 2012-10-01 DIAGNOSIS — F529 Unspecified sexual dysfunction not due to a substance or known physiological condition: Secondary | ICD-10-CM

## 2012-10-01 DIAGNOSIS — Z8742 Personal history of other diseases of the female genital tract: Secondary | ICD-10-CM

## 2012-10-01 DIAGNOSIS — L680 Hirsutism: Secondary | ICD-10-CM

## 2012-10-01 DIAGNOSIS — B9689 Other specified bacterial agents as the cause of diseases classified elsewhere: Secondary | ICD-10-CM

## 2012-10-01 DIAGNOSIS — B3731 Acute candidiasis of vulva and vagina: Secondary | ICD-10-CM

## 2012-10-01 DIAGNOSIS — B360 Pityriasis versicolor: Secondary | ICD-10-CM

## 2012-10-01 DIAGNOSIS — N760 Acute vaginitis: Secondary | ICD-10-CM

## 2012-10-01 DIAGNOSIS — B373 Candidiasis of vulva and vagina: Secondary | ICD-10-CM

## 2012-10-01 LAB — POCT CBC
HCT, POC: 44.2 % (ref 37.7–47.9)
Hemoglobin: 14 g/dL (ref 12.2–16.2)
Lymph, poc: 1.8 (ref 0.6–3.4)
MCH, POC: 28.7 pg (ref 27–31.2)
MCHC: 31.7 g/dL — AB (ref 31.8–35.4)
MCV: 90.5 fL (ref 80–97)
POC MID %: 4.8 %M (ref 0–12)
WBC: 7 10*3/uL (ref 4.6–10.2)

## 2012-10-01 LAB — POCT UA - MICROSCOPIC ONLY
Casts, Ur, LPF, POC: NEGATIVE
Crystals, Ur, HPF, POC: NEGATIVE
Yeast, UA: NEGATIVE

## 2012-10-01 LAB — POCT URINALYSIS DIPSTICK
Blood, UA: NEGATIVE
Glucose, UA: NEGATIVE
Nitrite, UA: NEGATIVE
Urobilinogen, UA: 0.2

## 2012-10-01 LAB — POCT WET PREP WITH KOH: KOH Prep POC: NEGATIVE

## 2012-10-01 LAB — COMPREHENSIVE METABOLIC PANEL
Albumin: 4.4 g/dL (ref 3.5–5.2)
BUN: 14 mg/dL (ref 6–23)
CO2: 24 mEq/L (ref 19–32)
Calcium: 9.5 mg/dL (ref 8.4–10.5)
Chloride: 106 mEq/L (ref 96–112)
Creat: 0.74 mg/dL (ref 0.50–1.10)
Potassium: 4.2 mEq/L (ref 3.5–5.3)

## 2012-10-01 LAB — PROLACTIN: Prolactin: 4 ng/mL

## 2012-10-01 LAB — HIV ANTIBODY (ROUTINE TESTING W REFLEX): HIV: NONREACTIVE

## 2012-10-01 LAB — RPR

## 2012-10-01 MED ORDER — KETOCONAZOLE 2 % EX CREA: TOPICAL_CREAM | Freq: Every day | CUTANEOUS | Status: DC

## 2012-10-01 MED ORDER — CEFTRIAXONE SODIUM 1 G IJ SOLR
250.0000 mg | Freq: Once | INTRAMUSCULAR | Status: AC
  Administered 2012-10-01: 250 mg via INTRAMUSCULAR

## 2012-10-01 MED ORDER — FLUCONAZOLE 150 MG PO TABS: 150.0000 mg | ORAL_TABLET | Freq: Once | ORAL | Status: DC

## 2012-10-01 MED ORDER — METRONIDAZOLE 500 MG PO TABS: 500.0000 mg | ORAL_TABLET | Freq: Two times a day (BID) | ORAL | Status: DC

## 2012-10-01 MED ORDER — CEFTRIAXONE SODIUM 250 MG IJ SOLR: 250.0000 mg | Freq: Once | INTRAMUSCULAR | Status: DC

## 2012-10-01 MED ORDER — FLUCONAZOLE 150 MG PO TABS: 300.0000 mg | ORAL_TABLET | Freq: Once | ORAL | Status: DC

## 2012-10-01 NOTE — Patient Instructions (Addendum)
Bacterial Vaginosis Bacterial vaginosis (BV) is a vaginal infection where the normal balance of bacteria in the vagina is disrupted. The normal balance is then replaced by an overgrowth of certain bacteria. There are several different kinds of bacteria that can cause BV. BV is the most common vaginal infection in women of childbearing age. CAUSES   The cause of BV is not fully understood. BV develops when there is an increase or imbalance of harmful bacteria.  Some activities or behaviors can upset the normal balance of bacteria in the vagina and put women at increased risk including:  Having a new sex partner or multiple sex partners.  Douching.  Using an intrauterine device (IUD) for contraception.  It is not clear what role sexual activity plays in the development of BV. However, women that have never had sexual intercourse are rarely infected with BV. Women do not get BV from toilet seats, bedding, swimming pools or from touching objects around them.  SYMPTOMS   Grey vaginal discharge.  A fish-like odor with discharge, especially after sexual intercourse.  Itching or burning of the vagina and vulva.  Burning or pain with urination.  Some women have no signs or symptoms at all. DIAGNOSIS  Your caregiver must examine the vagina for signs of BV. Your caregiver will perform lab tests and look at the sample of vaginal fluid through a microscope. They will look for bacteria and abnormal cells (clue cells), a pH test higher than 4.5, and a positive amine test all associated with BV.  RISKS AND COMPLICATIONS   Pelvic inflammatory disease (PID).  Infections following gynecology surgery.  Developing HIV.  Developing herpes virus. TREATMENT  Sometimes BV will clear up without treatment. However, all women with symptoms of BV should be treated to avoid complications, especially if gynecology surgery is planned. Female partners generally do not need to be treated. However, BV may spread  between female sex partners so treatment is helpful in preventing a recurrence of BV.   BV may be treated with antibiotics. The antibiotics come in either pill or vaginal cream forms. Either can be used with nonpregnant or pregnant women, but the recommended dosages differ. These antibiotics are not harmful to the baby.  BV can recur after treatment. If this happens, a second round of antibiotics will often be prescribed.  Treatment is important for pregnant women. If not treated, BV can cause a premature delivery, especially for a pregnant woman who had a premature birth in the past. All pregnant women who have symptoms of BV should be checked and treated.  For chronic reoccurrence of BV, treatment with a type of prescribed gel vaginally twice a week is helpful. HOME CARE INSTRUCTIONS   Finish all medication as directed by your caregiver.  Do not have sex until treatment is completed.  Tell your sexual partner that you have a vaginal infection. They should see their caregiver and be treated if they have problems, such as a mild rash or itching.  Practice safe sex. Use condoms. Only have 1 sex partner. PREVENTION  Basic prevention steps can help reduce the risk of upsetting the natural balance of bacteria in the vagina and developing BV:  Do not have sexual intercourse (be abstinent).  Do not douche.  Use all of the medicine prescribed for treatment of BV, even if the signs and symptoms go away.  Tell your sex partner if you have BV. That way, they can be treated, if needed, to prevent reoccurrence. SEEK MEDICAL CARE IF:     Your symptoms are not improving after 3 days of treatment.  You have increased discharge, pain, or fever. MAKE SURE YOU:   Understand these instructions.  Will watch your condition.  Will get help right away if you are not doing well or get worse. FOR MORE INFORMATION  Division of STD Prevention (DSTDP), Centers for Disease Control and Prevention:  SolutionApps.[employer].za American Social Health Association (ASHA): www.ashastd.org  Document Released: 02/25/2005 Document Revised: 05/20/2011 Document Reviewed: 08/18/2008 [facility] Patient Information 2014 [place], [place].   A depression medication called bupropion (Wellbutrin) can help women achieve orgasm. However, it can make some anxiety or insomnia symptoms worse so talk with your psychiatrist about this.  A proprietary blend of herbal supplements (Avlimil) can help women achieve climax. Many of the components of Avlimil are estrogenic, and so it is possible that it could cause/trigger some breast cancers.  Another product, a botanical feminine massage oil (Zestra), is applied to the clitoris, labia and vagina which did increase sexual arousal, orgasm, and pleasure.
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
## 2012-10-02 LAB — TESTOSTERONE, FREE, TOTAL, SHBG
Sex Hormone Binding: 89 nmol/L (ref 18–114)
Testosterone: 66 ng/dL (ref 10–70)

## 2012-10-03 ENCOUNTER — Encounter: Payer: Self-pay | Admitting: Family Medicine

## 2012-10-05 ENCOUNTER — Encounter: Payer: Self-pay | Admitting: Family Medicine

## 2012-10-05 ENCOUNTER — Telehealth: Payer: Self-pay | Admitting: Radiology

## 2012-10-05 LAB — PAP IG, CT-NG NAA, HPV HIGH-RISK

## 2012-10-05 NOTE — Telephone Encounter (Signed)
Spoke to patient regarding labs/ and medications. Explained why she needs flagyl and diflucan. She asked if okay to take these with Viibrid, advised per conversation with Dr Clelia Croft, this is fine, there is no interaction.

## 2012-10-09 MED ORDER — NORETHINDRONE ACET-ETHINYL EST 1.5-30 MG-MCG PO TABS: 1.0000 | ORAL_TABLET | Freq: Every day | ORAL | Status: DC

## 2012-10-09 NOTE — Telephone Encounter (Signed)
Will start OCPs to try to decrease hirsuitism. If not successful after 6 months, will consider adding in spironolactone. If good response after 6 months, try decreasing to low estrogen forumation

## 2012-10-11 NOTE — Telephone Encounter (Signed)
Please advise, there must be another message on this, I do not see in your reply anything about BCP, but this is what she is referring to, do you follow? I do not.

## 2012-10-13 ENCOUNTER — Encounter: Payer: Self-pay | Admitting: Family Medicine

## 2012-10-15 ENCOUNTER — Other Ambulatory Visit: Payer: Self-pay | Admitting: Family Medicine

## 2012-12-11 ENCOUNTER — Ambulatory Visit: Payer: PRIVATE HEALTH INSURANCE | Admitting: Family Medicine

## 2013-01-14 ENCOUNTER — Other Ambulatory Visit: Payer: Self-pay

## 2013-01-18 ENCOUNTER — Emergency Department (HOSPITAL_COMMUNITY)
Admission: EM | Admit: 2013-01-18 | Discharge: 2013-01-18 | Disposition: A | Discharge status: Home / Self Care | Payer: No Typology Code available for payment source | Attending: Emergency Medicine | Admitting: Emergency Medicine

## 2013-01-18 ENCOUNTER — Encounter (HOSPITAL_COMMUNITY): Payer: Self-pay | Admitting: Emergency Medicine

## 2013-01-18 DIAGNOSIS — J45901 Unspecified asthma with (acute) exacerbation: Secondary | ICD-10-CM | POA: Insufficient documentation

## 2013-01-18 DIAGNOSIS — Z91040 Latex allergy status: Secondary | ICD-10-CM | POA: Insufficient documentation

## 2013-01-18 DIAGNOSIS — Z8659 Personal history of other mental and behavioral disorders: Secondary | ICD-10-CM | POA: Insufficient documentation

## 2013-01-18 DIAGNOSIS — Z8679 Personal history of other diseases of the circulatory system: Secondary | ICD-10-CM | POA: Insufficient documentation

## 2013-01-18 LAB — BASIC METABOLIC PANEL
CO2: 21 mEq/L (ref 19–32)
Calcium: 9.1 mg/dL (ref 8.4–10.5)
Chloride: 102 mEq/L (ref 96–112)
Creatinine, Ser: 0.73 mg/dL (ref 0.50–1.10)
Glucose, Bld: 91 mg/dL (ref 70–99)
Sodium: 135 mEq/L (ref 135–145)

## 2013-01-18 LAB — CBC
HCT: 37.5 % (ref 36.0–46.0)
MCH: 28.3 pg (ref 26.0–34.0)
MCV: 85.6 fL (ref 78.0–100.0)
Platelets: 210 10*3/uL (ref 150–400)
RBC: 4.38 MIL/uL (ref 3.87–5.11)
WBC: 8.1 10*3/uL (ref 4.0–10.5)

## 2013-01-18 LAB — POCT I-STAT TROPONIN I: Troponin i, poc: 0 ng/mL (ref 0.00–0.08)

## 2013-01-18 MED ORDER — ASPIRIN 325 MG PO TABS
325.0000 mg | ORAL_TABLET | Freq: Once | ORAL | Status: AC
  Administered 2013-01-18: 325 mg via ORAL
  Filled 2013-01-18: qty 1

## 2013-01-18 MED ORDER — IPRATROPIUM BROMIDE 0.02 % IN SOLN
0.5000 mg | RESPIRATORY_TRACT | Status: AC
  Administered 2013-01-18 (×2): 0.5 mg via RESPIRATORY_TRACT
  Filled 2013-01-18 (×3): qty 2.5

## 2013-01-18 MED ORDER — ALBUTEROL SULFATE HFA 108 (90 BASE) MCG/ACT IN AERS
2.0000 | INHALATION_SPRAY | Freq: Once | RESPIRATORY_TRACT | Status: DC
  Filled 2013-01-18: qty 6.7

## 2013-01-18 MED ORDER — ALBUTEROL SULFATE (5 MG/ML) 0.5% IN NEBU: 5.0000 mg | INHALATION_SOLUTION | Freq: Once | RESPIRATORY_TRACT | Status: DC

## 2013-01-18 MED ORDER — ALBUTEROL SULFATE (5 MG/ML) 0.5% IN NEBU
2.5000 mg | INHALATION_SOLUTION | RESPIRATORY_TRACT | Status: AC
  Administered 2013-01-18 (×2): 2.5 mg via RESPIRATORY_TRACT
  Filled 2013-01-18 (×3): qty 0.5

## 2013-01-18 MED ORDER — PREDNISONE 20 MG PO TABS: 40.0000 mg | ORAL_TABLET | Freq: Every day | ORAL | Status: DC

## 2013-01-18 MED ORDER — PREDNISONE 20 MG PO TABS
60.0000 mg | ORAL_TABLET | Freq: Once | ORAL | Status: AC
  Administered 2013-01-18: 60 mg via ORAL
  Filled 2013-01-18: qty 3

## 2013-01-18 MED ORDER — ALBUTEROL SULFATE HFA 108 (90 BASE) MCG/ACT IN AERS: 1.0000 | INHALATION_SPRAY | Freq: Four times a day (QID) | RESPIRATORY_TRACT | Status: DC | PRN

## 2013-01-18 NOTE — ED Provider Notes (Signed)
CSN: [id number]     Arrival date & time 01/18/13  1203 History   First MD Initiated Contact with Patient 01/18/13 1219     Chief Complaint  Patient presents with  . Asthma  . Wheezing  . Shortness of Breath   (Consider location/radiation/quality/duration/timing/severity/associated sxs/prior Treatment) Patient is a 42 y.o. female presenting with asthma, wheezing, and shortness of breath. The history is provided by the patient.  Asthma This is a recurrent problem. The current episode started 1 to 2 hours ago. The problem occurs constantly. The problem has not changed since onset.Associated symptoms include chest pain (pressure) and shortness of breath. 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 Nothing relieves the symptoms. She has tried nothing for the symptoms.  Wheezing Associated symptoms: chest pain (pressure), cough and shortness of breath   Associated symptoms: no fever   Shortness of Breath Associated symptoms: chest pain (pressure), cough and wheezing   Associated symptoms: no fever     Past Medical History  Diagnosis Date  . Bipolar 1 disorder   . Hyperglycemia   . Migraines   . Asthma   . Depression   . ADD (attention deficit disorder)    Past Surgical History  Procedure Laterality Date  . Tubal ligation    . Cesarean section     Family History  Problem Relation Age of Onset  . Hypertension Father    History  Substance Use Topics  . Smoking status: Never Smoker   . Smokeless tobacco: Never Used  . Alcohol Use: No   OB History   Grav Para Term Preterm Abortions TAB SAB Ect Mult Living                 Review of Systems  Constitutional: Negative for fever.  Respiratory: Positive for cough, shortness of breath and wheezing.   Cardiovascular: Positive for chest pain (pressure).  All other systems reviewed and are negative.    Allergies  Amoxicillin; Darvocet; Latex; Percocet; and Promethazine hcl  Home Medications  No current outpatient  prescriptions on file. BP 102/63  Pulse 91  Temp(Src) 98.7 F (37.1 C) (Oral)  Resp 16  SpO2 99% Physical Exam  Nursing note and vitals reviewed. Constitutional: She is oriented to person, place, and time. She appears well-developed and well-nourished. No distress.  HENT:  Head: Normocephalic and atraumatic.  Eyes: EOM are normal. Pupils are equal, round, and reactive to light.  Neck: Normal range of motion. Neck supple.  Cardiovascular: Normal rate and regular rhythm.  Exam reveals no friction rub.   No murmur heard. Pulmonary/Chest: Effort normal and breath sounds normal. No respiratory distress. She has no wheezes. She has no rales.  Abdominal: Soft. She exhibits no distension. There is no tenderness. There is no rebound.  Musculoskeletal: Normal range of motion. She exhibits no edema.  Neurological: She is alert and oriented to person, place, and time. No cranial nerve deficit. Coordination normal.  Skin: No rash noted. She is not diaphoretic.    ED Course  Procedures (including critical care time) Labs Review Labs Reviewed  BASIC METABOLIC PANEL  CBC   Imaging Review No results found.  EKG Interpretation     Ventricular Rate:  91 PR Interval:  139 QRS Duration: 72 QT Interval:  361 QTC Calculation: 444 R Axis:   36 Text Interpretation:  Sinus rhythm Flipped T wave in V2, not seen on prior Flipped T wave in V1, similar to previous  MDM   1. Asthma exacerbation    42F presents with SOB. 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 She began having chest pressure, shortness of breath, and coughing. She states all these feels similar to her prior asthma attacks. States central pressure and chest tightness.  AFVSS here. No wheezing on exam. She asks multiple times for a work note claiming she cannot perform her job due to asthma, however I do not hear wheezes and she is moving air well in her lungs. After duonebs, lung exam  unchanged, she states some mild relief.  Serial troponins checked for her chest pressure, negative. Stable for discharge.     Dagmar Hait, MD 01/19/13 [phone]

## 2013-01-18 NOTE — Progress Notes (Signed)
[facility] provided pt with a list of primary care resources, [employer] orange Card application, and information about the MAP program with [facility].

## 2013-01-18 NOTE — ED Notes (Signed)
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 Pt states she doesn't have a rescue inhaler bc she cant afford it.

## 2013-01-26 ENCOUNTER — Inpatient Hospital Stay: Payer: PRIVATE HEALTH INSURANCE | Admitting: Internal Medicine

## 2013-03-29 MED ORDER — ACYCLOVIR 400 MG PO TABS: 400.0000 mg | ORAL_TABLET | Freq: Three times a day (TID) | ORAL | Status: DC

## 2013-03-29 NOTE — Telephone Encounter (Signed)
I was going to send in a rx for acyclovir for this pt to her pharmacy for a cold sore but her pharmacy is not listed - please call in or resend if pt prefers. Thanks, Dr. Brigitte Pulse

## 2013-03-30 ENCOUNTER — Telehealth: Payer: Self-pay

## 2013-03-30 NOTE — Telephone Encounter (Signed)
PT STATES DR Brigitte Pulse ANSWERED HER BUT SHE ISN'T ABLE TO GO INTO MYCHART AND GIVE HER A MESSAGE, WOULD LIKE TO KNOW IF SHE CAN GET A LOW DOSE OF ADDERALL AND WANTED TO  MAKE SURE HER MEDICINE WILL BE LESS EXPENSIVE. PLEASE CALL [place] ON [address]

## 2013-03-30 NOTE — Telephone Encounter (Signed)
No no adderrall - would need OV for this w/ MINIMUM of OV every 3 months and psych records w/ formal testing.

## 2013-03-30 NOTE — Telephone Encounter (Signed)
Pt states dr Brigitte Pulse was going to call in an rx for cold sores. Pt states was advised by someone here it was approved, but pt needs to know where the rx was sent to Please call pt to advise at [phone]

## 2013-03-30 NOTE — Telephone Encounter (Signed)
Ok, please send in the acyclovir there then. There is a duplicate mychart message as well so please close both out when done.

## 2013-03-30 NOTE — Telephone Encounter (Signed)
Called in Rx for acyclovir to [facility] [address] Dr Brigitte Pulse, please address the Adderall req.

## 2013-03-30 NOTE — Telephone Encounter (Signed)
PT STATES HER PHARMACY CLOSED DOWN SO WOULD LIKE HER MEDS TO BE SENT SOMEWHERE ELSE, DOESN'T KNOW WHAT THE DR IS GOING TO CALL IN, BUT REALLY WOULD LIKE THE $4.00 OR $5.00 PLAN. PLEASE CALL PT AT [place] ON [address]

## 2013-03-31 NOTE — Telephone Encounter (Signed)
Spoke to pt.  Advised she needs an OV for adderall She has started the acyclovlovir

## 2014-03-23 ENCOUNTER — Ambulatory Visit (HOSPITAL_BASED_OUTPATIENT_CLINIC_OR_DEPARTMENT_OTHER)
Admission: RE | Admit: 2014-03-23 | Discharge: 2014-03-23 | Disposition: A | Discharge status: Home / Self Care | Payer: No Typology Code available for payment source | Source: Ambulatory Visit | Attending: Family Medicine | Admitting: Family Medicine

## 2014-03-23 ENCOUNTER — Ambulatory Visit (INDEPENDENT_AMBULATORY_CARE_PROVIDER_SITE_OTHER): Payer: No Typology Code available for payment source

## 2014-03-23 ENCOUNTER — Encounter (HOSPITAL_BASED_OUTPATIENT_CLINIC_OR_DEPARTMENT_OTHER): Payer: Self-pay

## 2014-03-23 ENCOUNTER — Ambulatory Visit (INDEPENDENT_AMBULATORY_CARE_PROVIDER_SITE_OTHER): Payer: No Typology Code available for payment source | Admitting: Family Medicine

## 2014-03-23 VITALS — BP 124/74 | HR 92 | Temp 98.4°F | Resp 16 | Ht 63.0 in | Wt 118.0 lb

## 2014-03-23 DIAGNOSIS — J4520 Mild intermittent asthma, uncomplicated: Secondary | ICD-10-CM

## 2014-03-23 DIAGNOSIS — J45909 Unspecified asthma, uncomplicated: Secondary | ICD-10-CM | POA: Insufficient documentation

## 2014-03-23 DIAGNOSIS — F3162 Bipolar disorder, current episode mixed, moderate: Secondary | ICD-10-CM

## 2014-03-23 DIAGNOSIS — N644 Mastodynia: Secondary | ICD-10-CM

## 2014-03-23 DIAGNOSIS — K640 First degree hemorrhoids: Secondary | ICD-10-CM

## 2014-03-23 DIAGNOSIS — R791 Abnormal coagulation profile: Secondary | ICD-10-CM

## 2014-03-23 DIAGNOSIS — R0602 Shortness of breath: Secondary | ICD-10-CM | POA: Insufficient documentation

## 2014-03-23 DIAGNOSIS — R0789 Other chest pain: Secondary | ICD-10-CM

## 2014-03-23 DIAGNOSIS — J4521 Mild intermittent asthma with (acute) exacerbation: Secondary | ICD-10-CM

## 2014-03-23 DIAGNOSIS — Z862 Personal history of diseases of the blood and blood-forming organs and certain disorders involving the immune mechanism: Secondary | ICD-10-CM

## 2014-03-23 DIAGNOSIS — R7309 Other abnormal glucose: Secondary | ICD-10-CM

## 2014-03-23 DIAGNOSIS — R7989 Other specified abnormal findings of blood chemistry: Secondary | ICD-10-CM

## 2014-03-23 HISTORY — DX: Malignant (primary) neoplasm, unspecified: C80.1

## 2014-03-23 LAB — CBC
HEMATOCRIT: 37.4 % (ref 36.0–46.0)
Hemoglobin: 12.4 g/dL (ref 12.0–15.0)
MCH: 28.4 pg (ref 26.0–34.0)
MCHC: 33.2 g/dL (ref 30.0–36.0)
MCV: 85.6 fL (ref 78.0–100.0)
MPV: 10.8 fL (ref 8.6–12.4)
PLATELETS: 208 10*3/uL (ref 150–400)
RBC: 4.37 MIL/uL (ref 3.87–5.11)
RDW: 13.3 % (ref 11.5–15.5)
WBC: 6 10*3/uL (ref 4.0–10.5)

## 2014-03-23 LAB — COMPREHENSIVE METABOLIC PANEL
ALK PHOS: 76 U/L (ref 39–117)
ALT: 10 U/L (ref 0–35)
AST: 12 U/L (ref 0–37)
Albumin: 4 g/dL (ref 3.5–5.2)
BUN: 14 mg/dL (ref 6–23)
CALCIUM: 9.2 mg/dL (ref 8.4–10.5)
CO2: 27 mEq/L (ref 19–32)
CREATININE: 0.66 mg/dL (ref 0.50–1.10)
Chloride: 104 mEq/L (ref 96–112)
Glucose, Bld: 91 mg/dL (ref 70–99)
POTASSIUM: 4.2 meq/L (ref 3.5–5.3)
Sodium: 139 mEq/L (ref 135–145)
Total Bilirubin: 0.4 mg/dL (ref 0.2–1.2)
Total Protein: 6.5 g/dL (ref 6.0–8.3)

## 2014-03-23 LAB — HEMOGLOBIN A1C
HEMOGLOBIN A1C: 5.3 % (ref <5.7)
MEAN PLASMA GLUCOSE: 105 mg/dL (ref <117)

## 2014-03-23 LAB — TROPONIN I: Troponin I: <0.01 ng/mL (ref <0.06)

## 2014-03-23 LAB — D-DIMER, QUANTITATIVE: D-Dimer, Quant: 0.64 ug/mL-FEU — ABNORMAL HIGH (ref 0.00–0.48)

## 2014-03-23 MED ORDER — IOHEXOL 350 MG/ML SOLN
100.0000 mL | Freq: Once | INTRAVENOUS | Status: AC | PRN
  Administered 2014-03-23: 100 mL via INTRAVENOUS

## 2014-03-23 MED ORDER — ALBUTEROL SULFATE 108 (90 BASE) MCG/ACT IN AEPB: 2.0000 | INHALATION_SPRAY | Freq: Four times a day (QID) | RESPIRATORY_TRACT | Status: DC | PRN

## 2014-03-23 NOTE — Progress Notes (Signed)
[facility] [address] [place] [address] [phone]  Date:  03/23/2014   Name:  GWYNNE KEMNITZ   DOB:  04-12-1970   MRN:  [id number]  PCP:  Default, Provider, MD    Chief Complaint: breast issue   History of Present Illness:  Colleen Mccullough is a 44 y.o. very pleasant female patient who presents with the following:  Last seen here in 09/2012.  Here today with multiple concerns as below She notes "pain in my breasts for at least 6 months," she just got her insurance re-established so she decided to have this concern evaluated.  She has not noted any masses but both of her breasts feel sore. Never had a mammogram She also notes a ST for one day.  Also, she thinks that she has a hemorrhoid.  She just has a lump on her behind that will not go away.  She is not sure what to do about this.    No fever.   She has had a cough for a couple of days.   She then also mentions chest pain-" it feels like there is someone sitting on it" for a couple of days.  She thinks this is due to her "bronchitis acting up" and her asthma- it is not alarming or unusual to her.    She also has concerns about "my anemia acting up again," states that she is worried that her glucose is running high or low (she does not have diabetes or check her glucose at home), and also she needs a refill of her inhaler.  She notes easy bruising and "my sugar keeps going up and down, I don't have any way to check it but I know my body."   She states she has a history of asthma since 1990.  She has been unable to afford an inhaler and wonders if we have any cheaper alternatives.   Declines an albuterol neb now There are no active problems to display for this patient.   Past Medical History  Diagnosis Date  . Bipolar 1 disorder   . Hyperglycemia   . Migraines   . Asthma   . Depression   . ADD (attention deficit disorder)     Past Surgical History  Procedure Laterality Date  . Tubal  ligation    . Cesarean section      History  Substance Use Topics  . Smoking status: Never Smoker   . Smokeless tobacco: Never Used  . Alcohol Use: No    Family History  Problem Relation Age of Onset  . Hypertension Father     Allergies  Allergen Reactions  . Amoxicillin Other (See Comments)    White splotches  . Darvocet [Propoxyphene N-Acetaminophen] Nausea And Vomiting  . Latex Other (See Comments)    Irritates on the inside  . Percocet [Oxycodone-Acetaminophen] Nausea And Vomiting  . Promethazine Hcl Other (See Comments)    seizures    Medication list has been reviewed and updated.  Current Outpatient Prescriptions on File Prior to Visit  Medication Sig Dispense Refill  . albuterol (PROVENTIL HFA;VENTOLIN HFA) 108 (90 BASE) MCG/ACT inhaler Inhale 1-2 puffs into the lungs every 6 (six) hours as needed for wheezing or shortness of breath. (Patient not taking: Reported on 03/23/2014) 1 Inhaler 0   No current facility-administered medications on file prior to visit.    Review of Systems:  As per HPI- otherwise negative.   Physical Examination: Filed Vitals:  03/23/14 1154  BP: 124/74  Pulse: 105  Temp: 98.4 F (36.9 C)  Resp: 16   Filed Vitals:   03/23/14 1154  Height: 5\' 3"  (1.6 m)  Weight: 118 lb (53.524 kg)   Body mass index is 20.91 kg/(m^2). Ideal Body Weight: Weight in (lb) to have BMI = 25: 140.8  GEN: WDWN, NAD, Non-toxic, A & O x 3, slim build, looks well HEENT: Atraumatic, Normocephalic. Neck supple. No masses, No LAD.  Bilateral TM wnl, oropharynx normal.  PEERL,EOMI.   Ears and Nose: No external deformity. CV: RRR, No M/G/R. No JVD. No thrill. No extra heart sounds. PULM: CTA B, no wheezes, crackles, rhonchi. No retractions. No resp. distress. No accessory muscle use. ABD: S, NT, ND, +BS. No rebound. No HSM. EXTR: No c/c/e NEURO Normal gait.  PSYCH: Normally interactive. Conversant. Not depressed or anxious appearing.  Calm demeanor.   Breasts: normal exam except she endorses tenderness throughout both breasts. No masses, dimpling or discharge She has a tiny hemorrhoid at 9:00  EKG:  Rate 80, WNL, NSR  [facility] reading (PRIMARY) by  Dr. Lorelei Pont. CXR:  negative  CHEST 2 VIEW  COMPARISON: 03/12/2012.  FINDINGS: The heart size and mediastinal contours are within normal limits. Both lungs are clear. The visualized skeletal structures are unremarkable.  IMPRESSION: No active cardiopulmonary disease.  Assessment and Plan: Other chest pain - Plan: DG Chest 2 View, EKG 12-Lead, D-dimer, quantitative, Troponin I, CT Angio Chest W/Cm &/Or Wo Cm  Breast pain - Plan: MM DIGITAL SCREENING BILATERAL  First degree hemorrhoids  History of anemia - Plan: CBC  Blood glucose abnormal - Plan: Comprehensive metabolic panel, Hemoglobin A1c  Asthma with acute exacerbation, mild intermittent - Plan: Albuterol Sulfate (PROAIR RESPICLICK) 109 (90 BASE) MCG/ACT AEPB  SOB (shortness of breath) - Plan: CT Angio Chest W/Cm &/Or Wo Cm  Here today with multiple concerns as above.   Reassured that her breast exam is normal but she certainly needs a mammogram.  Will refer.   Tiny hemorrhoid.  It does not bother her.  Reassured that she does not need to do anything further if no symptoms  Check CBC, CMP, A1c as above due to her concerns about her blood sugar and history of anemia.  Reassured that if she feels like her glucose is low she should eat something but likely does not need to start monitoring her home glucose.  Chest pain.  Explained my concern given her description of "somone sitting on my chest."  She is not overly concerned as she attributes this feeling to her baseline RAD.  She declines ER evaluation.  She is willing to have a d dimer and troponin- understands that further evaluation will be needed if either is positive  Found a coupon for a free pro-air respiclick device for her to try  Signed Lamar Blinks,  MD  Received her stat labs as below:  Results for orders placed or performed in visit on 03/23/14  D-dimer, quantitative  Result Value Ref Range   D-Dimer, Quant 0.64 (H) 0.00 - 0.48 ug/mL-FEU  Troponin I  Result Value Ref Range   Troponin I <0.01 <0.06 ng/mL   Her troponin is ok but her D dimer is high.  Will need a CT angio.  Called her and she is ok with this plan.  Scheduled CT angio for tonight at the [facility] which is near her home.  She confirms that she is s/p BTL, had menses last week and is not  SA now.    Received CT angio CLINICAL DATA: Mid chest pressure and shortness of breath for 2 days, elevated D-dimer, asthma  EXAM: CT ANGIOGRAPHY CHEST WITH CONTRAST TECHNIQUE: Multidetector CT imaging of the chest was performed using the standard protocol during bolus administration of intravenous contrast. Multiplanar CT image reconstructions and MIPs were obtained to evaluate the vascular anatomy. CONTRAST: 149mL OMNIPAQUE IOHEXOL 350 MG/ML SOLN COMPARISON: None FINDINGS: Aorta well opacified without aneurysm or dissection. Pulmonary arteries well opacified and patent. No evidence of pulmonary embolism. Visualized upper abdomen normal appearance. No thoracic adenopathy. Lungs clear. No infiltrate, pleural effusion, pneumothorax or mass/nodule. Bones unremarkable. Review of the MIP images confirms the above findings. IMPRESSION: Normal exam.   Called her cell phone and went over results. She is relieved that her test is negative.  She will await the rest of her labs and I will be in touch with her

## 2014-03-23 NOTE — Patient Instructions (Signed)
Use the coupon to get a free albuterol inhaler.  This is a new product- you may be able to find a coupon online for further refills I will give you a call later on today with your labs to look for a possible blood clot in the lung and to check for any strain on your heart.  If either of these are abnormal we will have you go to the hospital Otherwise I will be in touch with the rest of your labs soon  We will get you set up for a mammogram

## 2014-03-24 ENCOUNTER — Telehealth: Payer: Self-pay

## 2014-03-24 NOTE — Telephone Encounter (Signed)
Pt calling about labs. Please advise. Thanks

## 2014-03-24 NOTE — Telephone Encounter (Signed)
Called her back- cell VM is full, the other number does not work.  Will send her results over mychart   Results for orders placed or performed in visit on 03/23/14  CBC  Result Value Ref Range   WBC 6.0 4.0 - 10.5 K/uL   RBC 4.37 3.87 - 5.11 MIL/uL   Hemoglobin 12.4 12.0 - 15.0 g/dL   HCT 37.4 36.0 - 46.0 %   MCV 85.6 78.0 - 100.0 fL   MCH 28.4 26.0 - 34.0 pg   MCHC 33.2 30.0 - 36.0 g/dL   RDW 13.3 11.5 - 15.5 %   Platelets 208 150 - 400 K/uL   MPV 10.8 8.6 - 12.4 fL  Comprehensive metabolic panel  Result Value Ref Range   Sodium 139 135 - 145 mEq/L   Potassium 4.2 3.5 - 5.3 mEq/L   Chloride 104 96 - 112 mEq/L   CO2 27 19 - 32 mEq/L   Glucose, Bld 91 70 - 99 mg/dL   BUN 14 6 - 23 mg/dL   Creat 0.66 0.50 - 1.10 mg/dL   Total Bilirubin 0.4 0.2 - 1.2 mg/dL   Alkaline Phosphatase 76 39 - 117 U/L   AST 12 0 - 37 U/L   ALT 10 0 - 35 U/L   Total Protein 6.5 6.0 - 8.3 g/dL   Albumin 4.0 3.5 - 5.2 g/dL   Calcium 9.2 8.4 - 10.5 mg/dL  Hemoglobin A1c  Result Value Ref Range   Hgb A1c MFr Bld 5.3 <5.7 %   Mean Plasma Glucose 105 <117 mg/dL  D-dimer, quantitative  Result Value Ref Range   D-Dimer, Quant 0.64 (H) 0.00 - 0.48 ug/mL-FEU  Troponin I  Result Value Ref Range   Troponin I <0.01 <0.06 ng/mL

## 2014-03-25 ENCOUNTER — Other Ambulatory Visit: Payer: Self-pay | Admitting: Family Medicine

## 2014-03-25 ENCOUNTER — Encounter: Payer: Self-pay | Admitting: Family Medicine

## 2014-03-26 ENCOUNTER — Ambulatory Visit (INDEPENDENT_AMBULATORY_CARE_PROVIDER_SITE_OTHER): Payer: No Typology Code available for payment source | Admitting: Family Medicine

## 2014-03-26 VITALS — BP 90/66 | HR 92 | Temp 98.1°F | Resp 16 | Ht 63.0 in | Wt 117.2 lb

## 2014-03-26 DIAGNOSIS — F4323 Adjustment disorder with mixed anxiety and depressed mood: Secondary | ICD-10-CM

## 2014-03-26 DIAGNOSIS — R5382 Chronic fatigue, unspecified: Secondary | ICD-10-CM

## 2014-03-26 DIAGNOSIS — E559 Vitamin D deficiency, unspecified: Secondary | ICD-10-CM

## 2014-03-26 DIAGNOSIS — Z862 Personal history of diseases of the blood and blood-forming organs and certain disorders involving the immune mechanism: Secondary | ICD-10-CM

## 2014-03-26 DIAGNOSIS — E538 Deficiency of other specified B group vitamins: Secondary | ICD-10-CM

## 2014-03-26 DIAGNOSIS — E611 Iron deficiency: Secondary | ICD-10-CM

## 2014-03-26 MED ORDER — BLOOD GLUCOSE MONITOR KIT: PACK | Status: DC

## 2014-03-26 MED ORDER — CLONAZEPAM 0.5 MG PO TABS: 0.2500 mg | ORAL_TABLET | Freq: Two times a day (BID) | ORAL | Status: DC | PRN
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
## 2014-03-26 NOTE — Patient Instructions (Addendum)
[facility]  [address], [place], [place] [address]  [phone]  [facility]  Address: [address], [place], [place] [address]  [phone]  [facility] - Dr. Dell Ponto Address: [address] [place], [place] [address]  Phone:([phone]) [place], [id number] Phone: [phone]  Generalized Anxiety Disorder Generalized anxiety disorder (GAD) is a mental disorder. It interferes with life functions, including relationships, work, and school. GAD is different from normal anxiety, which everyone experiences at some point in their lives in response to specific life events and activities. Normal anxiety actually helps [place] prepare for and get through these life events and activities. Normal anxiety goes away after the event or activity is over.  GAD causes anxiety that is not necessarily related to specific events or activities. It also causes excess anxiety in proportion to specific events or activities. The anxiety associated with GAD is also difficult to control. GAD can vary from mild to severe. People with severe GAD can have intense waves of anxiety with physical symptoms (panic attacks).  SYMPTOMS The anxiety and worry associated with GAD are difficult to control. This anxiety and worry are related to many life events and activities and also occur more days than not for 6 months or longer. People with GAD also have three or more of the following symptoms (one or more in children):  Restlessness.   Fatigue.  Difficulty concentrating.   Irritability.  Muscle tension.  Difficulty sleeping or unsatisfying sleep. DIAGNOSIS GAD is diagnosed through an assessment by your health care provider. Your health care provider will ask you questions aboutyour mood,physical symptoms, and events in your life. Your health care provider may ask  you about your medical history and use of alcohol or drugs, including prescription medicines. Your health care provider may also do a physical exam and blood tests. Certain medical conditions and the use of certain substances can cause symptoms similar to those associated with GAD. Your health care provider may refer you to a mental health specialist for further evaluation. TREATMENT The following therapies are usually used to treat GAD:   Medication. Antidepressant medication usually is prescribed for long-term daily control. Antianxiety medicines may be added in severe cases, especially when panic attacks occur.   Talk therapy (psychotherapy). Certain types of talk therapy can be helpful in treating GAD by providing support, education, and guidance. A form of talk therapy called cognitive behavioral therapy can teach you healthy ways to think about and react to daily life events and activities.  Stress managementtechniques. These include yoga, meditation, and exercise and can be very helpful when they are practiced regularly. A mental health specialist can help determine which treatment is best for you. Some people see improvement with one therapy. However, other people require a combination of therapies. Document Released: 06/22/2012 Document Revised: 07/12/2013 Document Reviewed: 06/22/2012 [facility] Patient Information 2015 [place], [place]. This information is not intended to replace advice given to you by your health care provider. Make sure you discuss any questions you have with your health care provider. Fatigue Fatigue is a feeling of tiredness, lack of energy, lack of motivation, or feeling tired all the time. Having enough rest, good nutrition, and reducing stress will normally reduce fatigue. Consult your caregiver if it persists. The nature of your fatigue will help your caregiver to find out its cause. The treatment is based on the cause.  CAUSES  There are many causes  for fatigue.  Most of the time, fatigue can be traced to one or more of your habits or routines. Most causes fit into one or more of three general areas. They are: Lifestyle problems  Sleep disturbances.  Overwork.  Physical exertion.  Unhealthy habits.  Poor eating habits or eating disorders.  Alcohol and/or drug use .  Lack of proper nutrition (malnutrition). Psychological problems  Stress and/or anxiety problems.  Depression.  Grief.  Boredom. Medical Problems or Conditions  Anemia.  Pregnancy.  Thyroid gland problems.  Recovery from major surgery.  Continuous pain.  Emphysema or asthma that is not well controlled  Allergic conditions.  Diabetes.  Infections (such as mononucleosis).  Obesity.  Sleep disorders, such as sleep apnea.  Heart failure or other heart-related problems.  Cancer.  Kidney disease.  Liver disease.  Effects of certain medicines such as antihistamines, cough and cold remedies, prescription pain medicines, heart and blood pressure medicines, drugs used for treatment of cancer, and some antidepressants. SYMPTOMS  The symptoms of fatigue include:   Lack of energy.  Lack of drive (motivation).  Drowsiness.  Feeling of indifference to the surroundings. DIAGNOSIS  The details of how you feel help guide your caregiver in finding out what is causing the fatigue. You will be asked about your present and past health condition. It is important to review all medicines that you take, including prescription and non-prescription items. A thorough exam will be done. You will be questioned about your feelings, habits, and normal lifestyle. Your caregiver may suggest blood tests, urine tests, or other tests to look for common medical causes of fatigue.  TREATMENT  Fatigue is treated by correcting the underlying cause. For example, if you have continuous pain or depression, treating these causes will improve how you feel. Similarly, adjusting the dose of  certain medicines will help in reducing fatigue.  HOME CARE INSTRUCTIONS   Try to get the required amount of good sleep every night.  Eat a healthy and nutritious diet, and drink enough water throughout the day.  Practice ways of relaxing (including yoga or meditation).  Exercise regularly.  Make plans to change situations that cause stress. Act on those plans so that stresses decrease over time. Keep your work and personal routine reasonable.  Avoid street drugs and minimize use of alcohol.  Start taking a daily multivitamin after consulting your caregiver. SEEK MEDICAL CARE IF:   You have persistent tiredness, which cannot be accounted for.  You have fever.  You have unintentional weight loss.  You have headaches.  You have disturbed sleep throughout the night.  You are feeling sad.  You have constipation.  You have dry skin.  You have gained weight.  You are taking any new or different medicines that you suspect are causing fatigue.  You are unable to sleep at night.  You develop any unusual swelling of your legs or other parts of your body. SEEK IMMEDIATE MEDICAL CARE IF:   You are feeling confused.  Your vision is blurred.  You feel faint or pass out.  You develop severe headache.  You develop severe abdominal, pelvic, or back pain.  You develop chest pain, shortness of breath, or an irregular or fast heartbeat.  You are unable to pass a normal amount of urine.  You develop abnormal bleeding such as bleeding from the rectum or you vomit blood.  You have thoughts about harming yourself or committing suicide.  You are worried that you might harm someone else. MAKE SURE  YOU:   Understand these instructions.  Will watch your condition.  Will get help right away if you are not doing well or get worse. Document Released: 12/23/2006 Document Revised: 05/20/2011 Document Reviewed: 06/29/2013 [facility] Patient Information 2015 [place], [place]. This  information is not intended to replace advice given to you by your health care provider. Make sure you discuss any questions you have with your health care provider.

## 2014-03-27 LAB — LIPASE: LIPASE: 13 U/L (ref 0–75)

## 2014-03-27 LAB — FERRITIN: Ferritin: 13 ng/mL (ref 10–291)

## 2014-03-27 LAB — VITAMIN B12: VITAMIN B 12: 282 pg/mL (ref 211–911)

## 2014-03-27 LAB — T4, FREE: Free T4: 0.98 ng/dL (ref 0.80–1.80)

## 2014-03-27 LAB — INSULIN, RANDOM: Insulin: 4.9 u[IU]/mL (ref 2.0–19.6)

## 2014-03-27 LAB — C-REACTIVE PROTEIN: CRP: <0.5 mg/dL (ref <0.60)

## 2014-03-27 LAB — TSH: TSH: 0.892 u[IU]/mL (ref 0.350–4.500)

## 2014-03-27 LAB — T3, FREE: T3, Free: 2.8 pg/mL (ref 2.3–4.2)

## 2014-03-28 ENCOUNTER — Telehealth: Payer: Self-pay

## 2014-03-28 DIAGNOSIS — E559 Vitamin D deficiency, unspecified: Secondary | ICD-10-CM | POA: Insufficient documentation

## 2014-03-28 LAB — VITAMIN D 25 HYDROXY (VIT D DEFICIENCY, FRACTURES): VIT D 25 HYDROXY: 16 ng/mL — AB (ref 30–100)

## 2014-03-28 LAB — SEDIMENTATION RATE: SED RATE: 1 mm/h (ref 0–22)

## 2014-03-28 MED ORDER — ERGOCALCIFEROL 1.25 MG (50000 UT) PO CAPS: 50000.0000 [IU] | ORAL_CAPSULE | ORAL | Status: DC

## 2014-03-28 NOTE — Telephone Encounter (Signed)
Requesting antibiotic on a four dollar plan - [facility] on [address]   [phone]

## 2014-03-28 NOTE — Telephone Encounter (Signed)
Pt was not seen for any condition that would warrant an antibiotic that I am aware of.  If she has concern for some sort of bacterial infection, she needs to [facility] for eval.

## 2014-03-28 NOTE — Telephone Encounter (Signed)
Pt called again. Advised pt of Dr. Raul Del message. Pt also wants to know if her note is going to be done today... Please advise.

## 2014-03-28 NOTE — Telephone Encounter (Signed)
Patient wants a note to [facility] on Wednesday.  Missing Monday and Tuesday.  Recently seen by Dr. Lorelei Pont and Dr. Brigitte Pulse.   Patient will pick up the note.    [phone]

## 2014-03-29 ENCOUNTER — Emergency Department (HOSPITAL_BASED_OUTPATIENT_CLINIC_OR_DEPARTMENT_OTHER): Payer: No Typology Code available for payment source

## 2014-03-29 ENCOUNTER — Emergency Department (HOSPITAL_BASED_OUTPATIENT_CLINIC_OR_DEPARTMENT_OTHER)
Admission: EM | Admit: 2014-03-29 | Discharge: 2014-03-29 | Disposition: A | Discharge status: Home / Self Care | Payer: No Typology Code available for payment source | Attending: Emergency Medicine | Admitting: Emergency Medicine

## 2014-03-29 ENCOUNTER — Encounter (HOSPITAL_BASED_OUTPATIENT_CLINIC_OR_DEPARTMENT_OTHER): Payer: Self-pay | Admitting: *Deleted

## 2014-03-29 DIAGNOSIS — F319 Bipolar disorder, unspecified: Secondary | ICD-10-CM | POA: Diagnosis not present

## 2014-03-29 DIAGNOSIS — Z859 Personal history of malignant neoplasm, unspecified: Secondary | ICD-10-CM | POA: Diagnosis not present

## 2014-03-29 DIAGNOSIS — J069 Acute upper respiratory infection, unspecified: Secondary | ICD-10-CM | POA: Diagnosis not present

## 2014-03-29 DIAGNOSIS — G43909 Migraine, unspecified, not intractable, without status migrainosus: Secondary | ICD-10-CM | POA: Diagnosis not present

## 2014-03-29 DIAGNOSIS — R112 Nausea with vomiting, unspecified: Secondary | ICD-10-CM | POA: Insufficient documentation

## 2014-03-29 DIAGNOSIS — R059 Cough, unspecified: Secondary | ICD-10-CM

## 2014-03-29 DIAGNOSIS — Z79899 Other long term (current) drug therapy: Secondary | ICD-10-CM | POA: Insufficient documentation

## 2014-03-29 DIAGNOSIS — R0981 Nasal congestion: Secondary | ICD-10-CM | POA: Diagnosis present

## 2014-03-29 DIAGNOSIS — J45909 Unspecified asthma, uncomplicated: Secondary | ICD-10-CM | POA: Insufficient documentation

## 2014-03-29 DIAGNOSIS — R05 Cough: Secondary | ICD-10-CM

## 2014-03-29 MED ORDER — ONDANSETRON 4 MG PO TBDP: 4.0000 mg | ORAL_TABLET | Freq: Three times a day (TID) | ORAL | Status: DC | PRN

## 2014-03-29 MED ORDER — NAPROXEN 500 MG PO TABS: 500.0000 mg | ORAL_TABLET | Freq: Two times a day (BID) | ORAL | Status: DC

## 2014-03-29 MED ORDER — BENZONATATE 100 MG PO CAPS: 100.0000 mg | ORAL_CAPSULE | Freq: Three times a day (TID) | ORAL | Status: DC

## 2014-03-29 MED ORDER — WARFARIN SODIUM 5 MG PO TABS
ORAL_TABLET | ORAL | Status: AC
  Filled 2014-03-29: qty 1

## 2014-03-29 NOTE — ED Provider Notes (Signed)
CSN: [id number]     Arrival date & time 03/29/14  0715 History   First MD Initiated Contact with Patient 03/29/14 [phone]     Chief Complaint  Patient presents with  . Nasal Congestion     (Consider location/radiation/quality/duration/timing/severity/associated sxs/prior Treatment) HPI Comments: The pt is a 44 y/o female with hx of anemia, Bipolar disorder and recently underwent testing for low Vit D and low B12 - she has been given all these supplements - she reports being seen twice this week at the [facility] for URI sx and has had no improvement in her cough, congestion.  These are constant, nothing makes better or worse, not associated with fevers, has tried TheraFlu and other over-the-counter medications with minimal relief.   She also complains of nausea and vomiting that happens after coughing and during the day after meals. She was recently seen at urgent care twice, multiple complaints including breast tenderness, thrombosed hemorrhoid and chronic fatigue, she has been on bronchodilators in the past but has recently refused these at her family doctor's office  The patient does complain of having a sick contact in her daughter who had similar symptoms that were prolonged but have now resolved.  No , chills, headache, visual changes, neck pain, back pain, chest pain, abdominal pain,  dysuria, diarrhea, rectal bleeding, swelling, rashes, numbness or weakness.   The history is provided by the patient.    Past Medical History  Diagnosis Date  . Bipolar 1 disorder   . Hyperglycemia   . Migraines   . Asthma   . Depression   . ADD (attention deficit disorder)   . Cancer    Past Surgical History  Procedure Laterality Date  . Tubal ligation    . Cesarean section     Family History  Problem Relation Age of Onset  . Hypertension Father    History  Substance Use Topics  . Smoking status: Never Smoker   . Smokeless tobacco: Never Used  . Alcohol Use: No   OB History    No data  available     Review of Systems  All other systems reviewed and are negative.     Allergies  Amoxicillin; Darvocet; Latex; Percocet; and Promethazine hcl  Home Medications   Prior to Admission medications   Medication Sig Start Date End Date Taking? Authorizing Provider  Albuterol Sulfate (PROAIR RESPICLICK) 196 (90 BASE) MCG/ACT AEPB Inhale 2 puffs into the lungs every 6 (six) hours as needed. 03/23/14   Gay Filler Copland, MD  benzonatate (TESSALON) 100 MG capsule Take 1 capsule (100 mg total) by mouth every 8 (eight) hours. 03/29/14   Johnna Acosta, MD  blood glucose meter kit and supplies KIT Dispense based on patient and insurance preference. Use up to four times daily as directed. (FOR ICD-9 250.00, 250.01). 03/26/14   Shawnee Knapp, MD  clonazePAM (KLONOPIN) 0.5 MG tablet Take 0.5-1 tablets (0.25-0.5 mg total) by mouth 2 (two) times daily as needed for anxiety. 03/26/14   Shawnee Knapp, MD  ergocalciferol (VITAMIN D2) 50000 UNITS capsule Take 1 capsule (50,000 Units total) by mouth once a week. 03/28/14   Shawnee Knapp, MD  naproxen (NAPROSYN) 500 MG tablet Take 1 tablet (500 mg total) by mouth 2 (two) times daily with a meal. 03/29/14   Johnna Acosta, MD  ondansetron (ZOFRAN ODT) 4 MG disintegrating tablet Take 1 tablet (4 mg total) by mouth every 8 (eight) hours as needed for nausea. 03/29/14   Mike Craze  Sabra Heck, MD   BP 110/73 mmHg  Pulse 98  Temp(Src) 98.3 F (36.8 C) (Oral)  Resp 18  Ht _0  (1.575 m)  Wt 117 lb (53.071 kg)  BMI 21.39 kg/m2  SpO2 100%  LMP 03/20/2014 Physical Exam  Constitutional: She appears well-developed and well-nourished. No distress.  HENT:  Head: Normocephalic and atraumatic.  Mouth/Throat: Oropharynx is clear and moist. No oropharyngeal exudate.  Minimal pharyngeal erythema, no exudate asymmetry or hypertrophy, tympanic membranes are normal, nasal passages are clear with mild rhinorrhea  Eyes: Conjunctivae and EOM are normal. Pupils are equal, round, and  reactive to light. Right eye exhibits no discharge. Left eye exhibits no discharge. No scleral icterus.  Neck: Normal range of motion. Neck supple. No JVD present. No thyromegaly present.  Cardiovascular: Normal rate, regular rhythm, normal heart sounds and intact distal pulses.  Exam reveals no gallop and no friction rub.   No murmur heard. Pulmonary/Chest: Effort normal and breath sounds normal. No respiratory distress. She has no wheezes. She has no rales.  Abdominal: Soft. Bowel sounds are normal. She exhibits no distension and no mass. There is no tenderness.  Musculoskeletal: Normal range of motion. She exhibits no edema or tenderness.  Lymphadenopathy:    She has no cervical adenopathy.  Neurological: She is alert. Coordination normal.  Skin: Skin is warm and dry. No rash noted. No erythema.  Psychiatric: She has a normal mood and affect. Her behavior is normal.  Nursing note and vitals reviewed.   ED Course  Procedures (including critical care time) Labs Review Labs Reviewed - No data to display  Imaging Review Dg Chest Port 1 View  03/29/2014   CLINICAL DATA:  One week history of cough and congestion  EXAM: PORTABLE CHEST - 1 VIEW  COMPARISON:  Chest radiograph and chest CT March 23, 2014  FINDINGS: There is no edema or consolidation. There are nipple shadows bilaterally. The heart size and pulmonary vascularity are normal. No adenopathy. No bone lesions.  IMPRESSION: No edema or consolidation.  Nipple shadows noted bilaterally.   Electronically Signed   By: Lowella Grip M.D.   On: 03/29/2014 08:15      MDM   Final diagnoses:  Cough  URI (upper respiratory infection)  Non-intractable vomiting with nausea, vomiting of unspecified type    Though the patient states that she feels "terrible" she has normal vital signs, her lung exam is clear, her oropharynx is clear and moist other than mild erythema, she has not had a flu shot and in fact states "I will never get that"  she seems to be overall well-appearing, will repeat chest x-ray to make sure she has not developed an interval pneumonia otherwise she can continue supportive care and has good follow-up at the urgent care where she has workup done.  Xray neg, supportive care  Filed Vitals:   03/29/14 0716  BP: 110/73  Pulse: 98  Temp: 98.3 F (36.8 C)  TempSrc: Oral  Resp: 18  Height: _1  (1.575 m)  Weight: 117 lb (53.071 kg)  SpO2: 100%     Meds given in ED:  Medications - No data to display  New Prescriptions   BENZONATATE (TESSALON) 100 MG CAPSULE    Take 1 capsule (100 mg total) by mouth every 8 (eight) hours.   NAPROXEN (NAPROSYN) 500 MG TABLET    Take 1 tablet (500 mg total) by mouth 2 (two) times daily with a meal.   ONDANSETRON (ZOFRAN ODT) 4 MG DISINTEGRATING TABLET  Take 1 tablet (4 mg total) by mouth every 8 (eight) hours as needed for nausea.        Johnna Acosta, MD 03/29/14 [phone]

## 2014-03-29 NOTE — Telephone Encounter (Signed)
I guess for this one time ok to write work note - will not be repeated - in future, she will need to be seen in clinic and can request during the [place] for this

## 2014-03-29 NOTE — ED Notes (Signed)
Pt amb to room 9 with quick steady gait in nad. Pt reports cough and congestion x 1 week, states she has been seen at urgent care x 2, with bloodwork "it showed low iron, b12 and vitamin d too low" pt pcp sent rx's for vitamin d, b12 and iron supplements. Pt states she cont with cough and congestion.

## 2014-03-29 NOTE — Telephone Encounter (Signed)
Dr. Brigitte Pulse please advise regarding work note

## 2014-03-29 NOTE — Telephone Encounter (Signed)
Spoke to pt- she was in ED this morning. She was diagnosed with the flu. She has a note for today and only needs one for Monday. This letter has been written and pt aware to pick up.

## 2014-03-29 NOTE — Discharge Instructions (Signed)
Please call your doctor for a followup appointment within 24-48 hours. When you talk to your doctor please let them know that you were seen in the emergency department and have them acquire all of your records so that they can discuss the findings with you and formulate a treatment plan to fully care for your new and ongoing problems. ° °

## 2014-04-03 ENCOUNTER — Telehealth: Payer: Self-pay

## 2014-04-03 NOTE — Telephone Encounter (Signed)
Pt LM on lab VM inquiring about labs. Spoke with pt. We sent labs via [facility]. She did receive them.

## 2014-09-19 ENCOUNTER — Encounter: Payer: Self-pay | Admitting: Family Medicine

## 2014-10-25 ENCOUNTER — Encounter (HOSPITAL_COMMUNITY): Payer: Self-pay

## 2014-10-25 ENCOUNTER — Emergency Department (HOSPITAL_COMMUNITY): Payer: No Typology Code available for payment source

## 2014-10-25 ENCOUNTER — Emergency Department (HOSPITAL_COMMUNITY)
Admission: EM | Admit: 2014-10-25 | Discharge: 2014-10-25 | Disposition: A | Discharge status: Home / Self Care | Payer: No Typology Code available for payment source | Attending: Emergency Medicine | Admitting: Emergency Medicine

## 2014-10-25 DIAGNOSIS — Z91040 Latex allergy status: Secondary | ICD-10-CM | POA: Insufficient documentation

## 2014-10-25 DIAGNOSIS — Z859 Personal history of malignant neoplasm, unspecified: Secondary | ICD-10-CM | POA: Insufficient documentation

## 2014-10-25 DIAGNOSIS — Z79899 Other long term (current) drug therapy: Secondary | ICD-10-CM | POA: Insufficient documentation

## 2014-10-25 DIAGNOSIS — G43909 Migraine, unspecified, not intractable, without status migrainosus: Secondary | ICD-10-CM | POA: Insufficient documentation

## 2014-10-25 DIAGNOSIS — J45909 Unspecified asthma, uncomplicated: Secondary | ICD-10-CM | POA: Insufficient documentation

## 2014-10-25 DIAGNOSIS — M7711 Lateral epicondylitis, right elbow: Secondary | ICD-10-CM | POA: Insufficient documentation

## 2014-10-25 DIAGNOSIS — Z880 Allergy status to penicillin: Secondary | ICD-10-CM | POA: Insufficient documentation

## 2014-10-25 DIAGNOSIS — F319 Bipolar disorder, unspecified: Secondary | ICD-10-CM | POA: Insufficient documentation

## 2014-10-25 HISTORY — DX: Hypoglycemia, unspecified: E16.2

## 2014-10-25 LAB — I-STAT CHEM 8, ED
BUN: 14 mg/dL (ref 6–20)
CHLORIDE: 106 mmol/L (ref 101–111)
Calcium, Ion: 1.23 mmol/L (ref 1.12–1.23)
Creatinine, Ser: 0.7 mg/dL (ref 0.44–1.00)
Glucose, Bld: 88 mg/dL (ref 65–99)
HEMATOCRIT: 40 % (ref 36.0–46.0)
Hemoglobin: 13.6 g/dL (ref 12.0–15.0)
POTASSIUM: 4.5 mmol/L (ref 3.5–5.1)
SODIUM: 139 mmol/L (ref 135–145)
TCO2: 21 mmol/L (ref 0–100)

## 2014-10-25 MED ORDER — ACETAMINOPHEN 500 MG PO TABS
1000.0000 mg | ORAL_TABLET | Freq: Once | ORAL | Status: DC
  Filled 2014-10-25: qty 2

## 2014-10-25 MED ORDER — IBUPROFEN 400 MG PO TABS
800.0000 mg | ORAL_TABLET | Freq: Once | ORAL | Status: DC
  Filled 2014-10-25: qty 1

## 2014-10-25 NOTE — ED Notes (Signed)
Gave patient something to drink while waiting.

## 2014-10-25 NOTE — ED Notes (Signed)
Pt c/o 3 week hx of right arm pain, fatigue and sharp pain under left breast.

## 2014-10-25 NOTE — ED Provider Notes (Signed)
CSN: [id number]     Arrival date & time 10/25/14  0930 History   First MD Initiated Contact with Patient 10/25/14 [phone]     Chief Complaint  Patient presents with  . Arm Pain  . Fatigue     (Consider location/radiation/quality/duration/timing/severity/associated sxs/prior Treatment) Patient is a 44 y.o. female presenting with extremity pain. The history is provided by the patient.  Extremity Pain This is a new problem. The current episode started less than 1 hour ago. The problem occurs constantly. The problem has not changed since onset.Pertinent negatives include no chest pain, no headaches and no shortness of breath. Nothing aggravates the symptoms. Nothing relieves the symptoms. She has tried nothing for the symptoms. The treatment provided no relief.   44 yo F with a chief complaint of right elbow pain. This been going on for at least 3 weeks. 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 Denies injury. Denies fevers or chills.  Patient also complaining of left-sided sharp chest pain. This is been off and on for the past couple years. Patient has not been able to figure out an inciting or alleviating factor. Transient lasting less than a minute. Past Medical History  Diagnosis Date  . Bipolar 1 disorder   . Hyperglycemia   . Migraines   . Asthma   . Depression   . ADD (attention deficit disorder)   . Cancer   . Hypoglycemia    Past Surgical History  Procedure Laterality Date  . Tubal ligation    . Cesarean section     Family History  Problem Relation Age of Onset  . Hypertension Father    Social History  Substance Use Topics  . Smoking status: Never Smoker   . Smokeless tobacco: Never Used  . Alcohol Use: No   OB History    No data available     Review of Systems  Constitutional: Negative for fever and chills.  HENT: Negative for congestion and rhinorrhea.   Eyes: Negative for redness and  visual disturbance.  Respiratory: Negative for shortness of breath and wheezing.   Cardiovascular: Negative for chest pain and palpitations.  Gastrointestinal: Negative for nausea and vomiting.  Genitourinary: Negative for dysuria and urgency.  Musculoskeletal: Positive for myalgias and arthralgias.  Skin: Negative for pallor and wound.  Neurological: Negative for dizziness and headaches.      Allergies  Amoxicillin; Darvocet; Latex; Percocet; and Promethazine hcl  Home Medications   Prior to Admission medications   Medication Sig Start Date End Date Taking? Authorizing Provider  BIOTIN PO Take 1 tablet by mouth daily.   Yes Historical Provider, MD  clonazePAM (KLONOPIN) 0.5 MG tablet Take 0.5-1 tablets (0.25-0.5 mg total) by mouth 2 (two) times daily as needed for anxiety. 03/26/14  Yes Shawnee Knapp, MD  ibuprofen (ADVIL,MOTRIN) 200 MG tablet Take 400 mg by mouth every 6 (six) hours as needed for mild pain or moderate pain.   Yes Historical Provider, MD  Albuterol Sulfate (PROAIR RESPICLICK) 325 (90 BASE) MCG/ACT AEPB Inhale 2 puffs into the lungs every 6 (six) hours as needed. Patient not taking: Reported on 10/25/2014 03/23/14   Darreld Mclean, MD  ergocalciferol (VITAMIN D2) 50000 UNITS capsule Take 1 capsule (50,000 Units total) by mouth once a week. Patient not taking: Reported on 10/25/2014 03/28/14   Shawnee Knapp, MD   BP 107/68 mmHg  Pulse 79  Temp(Src) 98.1 F (36.7 C) (Oral)  Resp 17  Wt 128 lb (58.06 kg)  SpO2 100%  LMP 10/09/2014 Physical Exam  Constitutional: She is oriented to person, place, and time. She appears well-developed and well-nourished. No distress.  HENT:  Head: Normocephalic and atraumatic.  Eyes: EOM are normal. Pupils are equal, round, and reactive to light.  Neck: Normal range of motion. Neck supple.  Cardiovascular: Normal rate and regular rhythm.  Exam reveals no gallop and no friction rub.   No murmur heard. Pulmonary/Chest: Effort normal. She  has no wheezes. She has no rales.  Abdominal: Soft. She exhibits no distension. There is no tenderness.  Musculoskeletal: She exhibits tenderness (tender palpation to the right lateral epicondyles. Worse with external rotation of the forearm. Pulse motor and sensation intact distally). She exhibits no edema.  Neurological: She is alert and oriented to person, place, and time.  Skin: Skin is warm and dry. She is not diaphoretic.  Psychiatric: She has a normal mood and affect. Her behavior is normal.    ED Course  Procedures (including critical care time) Labs Review Labs Reviewed  I-STAT CHEM 8, ED    Imaging Review Dg Chest 2 View  10/25/2014   CLINICAL DATA:  Left side chest pain off and on for several months. Intermittent shortness of breath.  EXAM: CHEST  2 VIEW  COMPARISON:  03/29/2014  FINDINGS: The heart size and mediastinal contours are within normal limits. Both lungs are clear. The visualized skeletal structures are unremarkable.  IMPRESSION: No active cardiopulmonary disease.   Electronically Signed   By: Rolm Baptise M.D.   On: 10/25/2014 11:28   I, Cecilio Asper, personally reviewed and evaluated these images and lab results as part of my medical decision-making.   EKG Interpretation   Date/Time:  Tuesday October 25 2014 09:44:20 EDT Ventricular Rate:  86 PR Interval:  132 QRS Duration: 78 QT Interval:  384 QTC Calculation: 459 R Axis:   48 Text Interpretation:  Sinus rhythm flipped t waves have resolved Otherwise  no significant change Confirmed by Olevia Westervelt MD, DANIEL ([id number]) on 10/25/2014  11:23:58 AM      MDM   Final diagnoses:  Tennis elbow syndrome, right    44 yo F with a chief complaint of right elbow pain. Tennis elbow per her history and physical. Patient also with left-sided sharp chest pain. Atypical for ACS. Patient with no significant risk factors. Will obtain a chest x-ray EKG. Feel no need for biomarkers at this time. PERC negative.   4:33 PM:   I have discussed the diagnosis/risks/treatment options with the patient and family and believe the pt to be eligible for discharge home to follow-up with PCP. We also discussed returning to the ED immediately if new or worsening sx occur. We discussed the sx which are most concerning (e.g., sudden worsening chest pain) that necessitate immediate return. Medications administered to the patient during their visit and any new prescriptions provided to the patient are listed below.  Medications given during this visit Medications - No data to display  Discharge Medication List as of 10/25/2014 12:25 PM       The patient appears reasonably screen and/or stabilized for discharge and I doubt any other medical condition or other [facility] requiring further screening, evaluation, or treatment in the ED at this time prior to discharge.    Deno Etienne, DO 10/25/14 [phone]

## 2014-10-25 NOTE — Discharge Instructions (Signed)
°  Take 4 over the counter ibuprofen tablets 3 times a day or 2 over-the-counter naproxen tablets twice a day for pain.  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 Although the condition is often called tennis or golfer's elbow, it is caused by any repetitive action performed by your elbow. HOME CARE INSTRUCTIONS  If the condition has been short lived, rest may be the only treatment required. Using your opposite hand or arm to perform the task may help. Even changing your grip may help rest the extremity. These may even prevent the condition from recurring.  Longer standing problems, however, will often be relieved faster by:  Using anti-inflammatory agents.  Applying ice packs for 30 minutes at the end of the working day, at bed time, or when activities are finished.  Your caregiver may also have you wear a splint or sling. This will allow the inflamed tendon to heal. At times, steroid injections aided with a local anesthetic will be required along with splinting for 1 to 2 weeks. Two to three steroid injections will often solve the problem. In some long standing cases, the inflamed tendon does not respond to conservative (non-surgical) therapy. Then surgery may be required to repair it. MAKE SURE YOU:   Understand these instructions.  Will watch your condition.  Will get help right away if you are not doing well or get worse. Document Released: 02/25/2005 Document Revised: 05/20/2011 Document Reviewed: 10/14/2007 [facility] Patient Information 2015 [place], [place]. This information is not intended to replace advice given to you by your health care provider. Make sure you discuss any questions you have with your health care provider.

## 2014-10-25 NOTE — ED Notes (Signed)
Patient returned from [place]. Phlebotomy at the bedside.

## 2014-11-24 ENCOUNTER — Ambulatory Visit: Payer: Self-pay | Admitting: Family Medicine

## 2015-01-05 ENCOUNTER — Ambulatory Visit: Payer: No Typology Code available for payment source | Admitting: Family Medicine

## 2015-01-24 ENCOUNTER — Emergency Department (HOSPITAL_COMMUNITY): Payer: No Typology Code available for payment source

## 2015-01-24 ENCOUNTER — Emergency Department (HOSPITAL_COMMUNITY)
Admission: EM | Admit: 2015-01-24 | Discharge: 2015-01-24 | Disposition: A | Discharge status: Home / Self Care | Payer: No Typology Code available for payment source | Attending: Emergency Medicine | Admitting: Emergency Medicine

## 2015-01-24 ENCOUNTER — Encounter (HOSPITAL_COMMUNITY): Payer: Self-pay | Admitting: Emergency Medicine

## 2015-01-24 DIAGNOSIS — M722 Plantar fascial fibromatosis: Secondary | ICD-10-CM | POA: Insufficient documentation

## 2015-01-24 DIAGNOSIS — J45909 Unspecified asthma, uncomplicated: Secondary | ICD-10-CM | POA: Insufficient documentation

## 2015-01-24 DIAGNOSIS — G43909 Migraine, unspecified, not intractable, without status migrainosus: Secondary | ICD-10-CM | POA: Insufficient documentation

## 2015-01-24 DIAGNOSIS — F319 Bipolar disorder, unspecified: Secondary | ICD-10-CM | POA: Insufficient documentation

## 2015-01-24 DIAGNOSIS — Z79899 Other long term (current) drug therapy: Secondary | ICD-10-CM | POA: Insufficient documentation

## 2015-01-24 DIAGNOSIS — Z8589 Personal history of malignant neoplasm of other organs and systems: Secondary | ICD-10-CM | POA: Insufficient documentation

## 2015-01-24 MED ORDER — IBUPROFEN 800 MG PO TABS: 800.0000 mg | ORAL_TABLET | Freq: Three times a day (TID) | ORAL | Status: DC | PRN

## 2015-01-24 MED ORDER — PREDNISONE 50 MG PO TABS: 50.0000 mg | ORAL_TABLET | Freq: Every day | ORAL | Status: DC

## 2015-01-24 MED ORDER — HYDROCODONE-ACETAMINOPHEN 5-325 MG PO TABS: 1.0000 | ORAL_TABLET | Freq: Four times a day (QID) | ORAL | Status: DC | PRN

## 2015-01-24 NOTE — ED Notes (Signed)
C/o right heel pain x 2 days. Hx of same multiple times in the past.

## 2015-01-24 NOTE — Discharge Instructions (Signed)
Return here as needed.  Use ice on her foot and heel.  Follow-up with the orthopedist provided

## 2015-01-24 NOTE — ED Provider Notes (Signed)
CSN: [id number]     Arrival date & time 01/24/15  1427 History  By signing my name below, I, Erling Conte, attest that this documentation has been prepared under the direction and in the presence of Dalia Heading, PA-C Electronically Signed: Erling Conte, ED Scribe. 01/24/2015. 2:55 PM.     Chief Complaint  Patient presents with  . Foot Pain   The history is provided by the patient. No language interpreter was used.    HPI Comments: Colleen Mccullough is a 44 y.o. female who presents to the Emergency Department complaining of constant, moderate, throbbing, pain to the heel of her right foot onset 2 days. She states the pain radiates up into her right calf. Pt reports that the pain is exacerbated with applying pressure to the heel of her foot. She notes that she is on her feet all day at work. She denies any direct injury or trauma to the foot or achilles tendon. She denies any calf pain, leg swelling or foot swelling.   Past Medical History  Diagnosis Date  . Bipolar 1 disorder ([place])   . Hyperglycemia   . Migraines   . Asthma   . Depression   . ADD (attention deficit disorder)   . Cancer ([place])   . Hypoglycemia    Past Surgical History  Procedure Laterality Date  . Tubal ligation    . Cesarean section     Family History  Problem Relation Age of Onset  . Hypertension Father    Social History  Substance Use Topics  . Smoking status: Never Smoker   . Smokeless tobacco: Never Used  . Alcohol Use: No   OB History    No data available     Review of Systems 10 Systems reviewed and all are negative for acute change except as noted in the HPI.     Allergies  Amoxicillin; Darvocet; Latex; Percocet; and Promethazine hcl  Home Medications   Prior to Admission medications   Medication Sig Start Date End Date Taking? Authorizing Provider  Albuterol Sulfate (PROAIR RESPICLICK) 123XX123 (90 BASE) MCG/ACT AEPB Inhale 2 puffs into the lungs every 6 (six) hours as  needed. Patient not taking: Reported on 10/25/2014 03/23/14   Gay Filler Copland, MD  BIOTIN PO Take 1 tablet by mouth daily.    Historical Provider, MD  clonazePAM (KLONOPIN) 0.5 MG tablet Take 0.5-1 tablets (0.25-0.5 mg total) by mouth 2 (two) times daily as needed for anxiety. 03/26/14   Shawnee Knapp, MD  ergocalciferol (VITAMIN D2) 50000 UNITS capsule Take 1 capsule (50,000 Units total) by mouth once a week. Patient not taking: Reported on 10/25/2014 03/28/14   Shawnee Knapp, MD  ibuprofen (ADVIL,MOTRIN) 200 MG tablet Take 400 mg by mouth every 6 (six) hours as needed for mild pain or moderate pain.    Historical Provider, MD   Triage Vitals: BP 113/72 mmHg  Pulse 92  Temp(Src) 97.9 F (36.6 C) (Oral)  Resp 18  SpO2 99%  Physical Exam  Constitutional: She is oriented to person, place, and time. She appears well-developed and well-nourished. No distress.  HENT:  Head: Normocephalic and atraumatic.  Eyes: Conjunctivae and EOM are normal.  Neck: Neck supple. No tracheal deviation present.  Cardiovascular: Normal rate.   Pulmonary/Chest: Effort normal. No respiratory distress.  Musculoskeletal: Normal range of motion.  Neurological: She is alert and oriented to person, place, and time.  Skin: Skin is warm and dry.  Psychiatric: She has a normal mood and affect.  Her behavior is normal.  Nursing note and vitals reviewed.   ED Course  Procedures (including critical care time)  DIAGNOSTIC STUDIES: Oxygen Saturation is 99% on RA, normal by my interpretation.    COORDINATION OF CARE:  2:54 PM- Will order plain films of right foot. Pt advised of plan for treatment and pt agrees.    Labs Review Labs Reviewed - No data to display  Imaging Review Dg Foot Complete Right  01/24/2015  CLINICAL DATA:  Right foot pain for 2 months EXAM: RIGHT FOOT COMPLETE - 3+ VIEW COMPARISON:  None. FINDINGS: Three views of the right foot submitted. No acute fracture or subluxation. There is a linear at  metallic foreign body within soft tissue just medial to distal phalanx great toe measures 3 mm length. IMPRESSION: No acute fracture or subluxation. Tiny metallic foreign body within soft tissue medial to distal phalanx great toe measures about 3 mm. Electronically Signed   By: Lahoma Crocker M.D.   On: 01/24/2015 15:56   I have personally reviewed and evaluated these images as part of my medical decision-making.  A shortness of his plantar fasciitis and Achilles tendinitis.  Patient stands and walks a lot.  Patient's best return here as needed.  Will be given orthopedic follow-up  Dalia Heading, PA-C 01/24/15 1603  Charlesetta Shanks, MD 02/08/15 [phone]

## 2015-01-24 NOTE — ED Notes (Signed)
Pt c/o right heel pain x 2 days; pt denies obvious injury

## 2015-02-13 ENCOUNTER — Emergency Department (HOSPITAL_COMMUNITY)
Admission: EM | Admit: 2015-02-13 | Discharge: 2015-02-13 | Disposition: A | Discharge status: Home / Self Care | Payer: No Typology Code available for payment source | Attending: Emergency Medicine | Admitting: Emergency Medicine

## 2015-02-13 ENCOUNTER — Encounter (HOSPITAL_COMMUNITY): Payer: Self-pay | Admitting: Emergency Medicine

## 2015-02-13 DIAGNOSIS — Z79899 Other long term (current) drug therapy: Secondary | ICD-10-CM | POA: Insufficient documentation

## 2015-02-13 DIAGNOSIS — Z91040 Latex allergy status: Secondary | ICD-10-CM | POA: Insufficient documentation

## 2015-02-13 DIAGNOSIS — Z7952 Long term (current) use of systemic steroids: Secondary | ICD-10-CM | POA: Insufficient documentation

## 2015-02-13 DIAGNOSIS — J069 Acute upper respiratory infection, unspecified: Secondary | ICD-10-CM | POA: Insufficient documentation

## 2015-02-13 DIAGNOSIS — F319 Bipolar disorder, unspecified: Secondary | ICD-10-CM | POA: Insufficient documentation

## 2015-02-13 DIAGNOSIS — G43909 Migraine, unspecified, not intractable, without status migrainosus: Secondary | ICD-10-CM | POA: Insufficient documentation

## 2015-02-13 DIAGNOSIS — Z8639 Personal history of other endocrine, nutritional and metabolic disease: Secondary | ICD-10-CM | POA: Insufficient documentation

## 2015-02-13 DIAGNOSIS — Z859 Personal history of malignant neoplasm, unspecified: Secondary | ICD-10-CM | POA: Insufficient documentation

## 2015-02-13 DIAGNOSIS — J45909 Unspecified asthma, uncomplicated: Secondary | ICD-10-CM | POA: Insufficient documentation

## 2015-02-13 DIAGNOSIS — B9789 Other viral agents as the cause of diseases classified elsewhere: Secondary | ICD-10-CM

## 2015-02-13 DIAGNOSIS — Z880 Allergy status to penicillin: Secondary | ICD-10-CM | POA: Insufficient documentation

## 2015-02-13 MED ORDER — PSEUDOEPHEDRINE HCL 60 MG PO TABS: 60.0000 mg | ORAL_TABLET | Freq: Four times a day (QID) | ORAL | Status: DC | PRN

## 2015-02-13 MED ORDER — BENZONATATE 100 MG PO CAPS: 200.0000 mg | ORAL_CAPSULE | Freq: Two times a day (BID) | ORAL | Status: DC | PRN

## 2015-02-13 MED ORDER — OXYMETAZOLINE HCL 0.05 % NA SOLN: 1.0000 | Freq: Two times a day (BID) | NASAL | Status: DC

## 2015-02-13 MED ORDER — ALBUTEROL SULFATE HFA 108 (90 BASE) MCG/ACT IN AERS
2.0000 | INHALATION_SPRAY | Freq: Once | RESPIRATORY_TRACT | Status: AC
  Administered 2015-02-13: 2 via RESPIRATORY_TRACT
  Filled 2015-02-13: qty 6.7

## 2015-02-13 NOTE — ED Notes (Signed)
Pt c/o nasal congestion, cough, and sore throat since Saturday.

## 2015-02-13 NOTE — ED Provider Notes (Signed)
CSN: [id number]     Arrival date & time 02/13/15  [id number] History   First MD Initiated Contact with Patient 02/13/15 1024     Chief Complaint  Patient presents with  . Cough  . Nasal Congestion  . Sore Throat     (Consider location/radiation/quality/duration/timing/severity/associated sxs/prior Treatment) HPI   Patient is a 44 year old female with past medical history of asthma who presents to the ED complaining of cold-like symptoms, onset 2 days. Patient reports she has had nasal congestion/rhinorrhea, nonproductive cough and a sore throat for the past 2 days. She also endorses having mild midsternal chest tightness that she says is consistent with pain she has had in the past related to her asthma, she notes she is currently out of her albuterol inhaler. Patient also states she has had intermittent nausea but notes that that has since resolved. She has not tried taking any medications at home. Denies fever, chills, ear pain, headache, difficulty breathing, wheezing, chest pain, abdominal pain, vomiting.   Past Medical History  Diagnosis Date  . Bipolar 1 disorder ([place])   . Hyperglycemia   . Migraines   . Asthma   . Depression   . ADD (attention deficit disorder)   . Cancer ([place])   . Hypoglycemia    Past Surgical History  Procedure Laterality Date  . Tubal ligation    . Cesarean section     Family History  Problem Relation Age of Onset  . Hypertension Father    Social History  Substance Use Topics  . Smoking status: Never Smoker   . Smokeless tobacco: Never Used  . Alcohol Use: No   OB History    No data available     Review of Systems  Constitutional: Negative for fever and chills.  HENT: Positive for congestion, rhinorrhea and sore throat. Negative for ear pain, sinus pressure, sneezing, trouble swallowing and voice change.   Eyes: Negative for discharge and itching.  Respiratory: Positive for cough and chest tightness. Negative for shortness of breath and wheezing.    Cardiovascular: Negative for chest pain.  Gastrointestinal: Positive for nausea. Negative for vomiting and abdominal pain.  Skin: Negative for rash.  Neurological: Negative for dizziness, weakness and headaches.      Allergies  Amoxicillin; Darvocet; Latex; Percocet; and Promethazine hcl  Home Medications   Prior to Admission medications   Medication Sig Start Date End Date Taking? Authorizing Provider  Albuterol Sulfate (PROAIR RESPICLICK) 123XX123 (90 BASE) MCG/ACT AEPB Inhale 2 puffs into the lungs every 6 (six) hours as needed. Patient not taking: Reported on 10/25/2014 03/23/14   Darreld Mclean, MD  benzonatate (TESSALON) 100 MG capsule Take 2 capsules (200 mg total) by mouth 2 (two) times daily as needed for cough. 02/13/15   Nona Dell, PA-C  BIOTIN PO Take 1 tablet by mouth daily.    Historical Provider, MD  clonazePAM (KLONOPIN) 0.5 MG tablet Take 0.5-1 tablets (0.25-0.5 mg total) by mouth 2 (two) times daily as needed for anxiety. 03/26/14   Shawnee Knapp, MD  ergocalciferol (VITAMIN D2) 50000 UNITS capsule Take 1 capsule (50,000 Units total) by mouth once a week. Patient not taking: Reported on 10/25/2014 03/28/14   Shawnee Knapp, MD  HYDROcodone-acetaminophen (NORCO/VICODIN) 5-325 MG tablet Take 1 tablet by mouth every 6 (six) hours as needed for moderate pain. 01/24/15   Dalia Heading, PA-C  ibuprofen (ADVIL,MOTRIN) 800 MG tablet Take 1 tablet (800 mg total) by mouth every 8 (eight) hours as needed. 01/24/15  Dalia Heading, PA-C  oxymetazoline (AFRIN NASAL SPRAY) 0.05 % nasal spray Place 1 spray into both nostrils 2 (two) times daily. 02/13/15   Nona Dell, PA-C  predniSONE (DELTASONE) 50 MG tablet Take 1 tablet (50 mg total) by mouth daily with breakfast. 01/24/15   Dalia Heading, PA-C  predniSONE (DELTASONE) 50 MG tablet Take 1 tablet (50 mg total) by mouth daily with breakfast. 01/24/15   Dalia Heading, PA-C   BP 99/59 mmHg  Pulse 81   Temp(Src) 98.2 F (36.8 C) (Oral)  Resp 18  SpO2 100% Physical Exam  Constitutional: She is oriented to person, place, and time. She appears well-developed and well-nourished. No distress.  HENT:  Head: Normocephalic and atraumatic.  Right Ear: Tympanic membrane normal.  Left Ear: Tympanic membrane normal.  Nose: Rhinorrhea present. Right sinus exhibits no maxillary sinus tenderness and no frontal sinus tenderness. Left sinus exhibits no maxillary sinus tenderness and no frontal sinus tenderness.  Mouth/Throat: Uvula is midline and mucous membranes are normal. Posterior oropharyngeal erythema present. No oropharyngeal exudate, posterior oropharyngeal edema or tonsillar abscesses.  Eyes: Conjunctivae and EOM are normal. Right eye exhibits no discharge. Left eye exhibits no discharge. No scleral icterus.  Neck: Normal range of motion. Neck supple.  Cardiovascular: Normal rate, regular rhythm, normal heart sounds and intact distal pulses.   Pulmonary/Chest: Effort normal and breath sounds normal. No respiratory distress. She has no wheezes. She has no rales. She exhibits no tenderness.  Abdominal: Soft. Bowel sounds are normal. There is no tenderness.  Musculoskeletal: Normal range of motion. She exhibits no edema.  Lymphadenopathy:    She has no cervical adenopathy.  Neurological: She is alert and oriented to person, place, and time.  Skin: Skin is warm and dry.  Nursing note and vitals reviewed.   ED Course  Procedures (including critical care time) Labs Review Labs Reviewed - No data to display  Imaging Review No results found. I have personally reviewed and evaluated these images and lab results as part of my medical decision-making.  Filed Vitals:   02/13/15 1008  BP: 99/59  Pulse: 81  Temp: 98.2 F (36.8 C)  Resp: 18     MDM   Final diagnoses:  Viral URI with cough    Patient presents with nasal congestion/rhinorrhea, nonproductive cough, sore throat. Denies fever,  wheezing or shortness of breath. History of asthma, currently out of albuterol inhaler. VSS. Exam revealed rhinorrhea and mild posterior oral pharyngeal erythema, lungs clear to auscultation bilaterally. Presentation is consistent with viral URI. I do not suspect pneumonia, acute bronchitis, acute asthma exacerbation, peritonsillar abscess or strep pharyngitis and do not feel that any further workup or imaging is warranted at this time. Plan to discharge patient home with decongestion and antitussive. Patient given resource guide to follow up with PCP.  Evaluation does not show pathology requring ongoing emergent intervention or admission. Pt is hemodynamically stable and mentating appropriately. Discussed findings/results and plan with patient/guardian, who agrees with plan. All questions answered. Return precautions discussed and outpatient follow up given.      Saisha Grapes [place], [place] 02/13/15 1112  Pattricia Boss, MD 02/14/15 [phone]

## 2015-02-13 NOTE — ED Notes (Signed)
Bed: WA27 Expected date:  Expected time:  Means of arrival:  Comments: 

## 2015-02-13 NOTE — ED Notes (Signed)
Patient has not taken or tried any over the counter medications.

## 2015-02-13 NOTE — Discharge Instructions (Signed)
Take your medications as prescribed. You may also use your albuterol inhaler as needed for wheezing/shortness of breath. Please follow up with a primary care provider from the Resource Guide provided below in 4-5 days. Please return to the Emergency Department if symptoms worsen or new onset of fever, difficulty breathing, severe wheezing, chest pain, productive cough, vomiting, coughing up blood, vomiting blood.    Emergency Department Resource Guide 1) Find a Doctor and Pay Out of Pocket Although you won't have to find out who is covered by your insurance plan, it is a good idea to ask around and get recommendations. You will then need to call the office and see if the doctor you have chosen will accept you as a new patient and what types of options they offer for patients who are self-pay. Some doctors offer discounts or will set up payment plans for their patients who do not have insurance, but you will need to ask so you aren't surprised when you get to your appointment.  2) Contact Your Local Health Department Not all health departments have doctors that can see patients for sick visits, but many do, so it is worth a call to see if yours does. If you don't know where your local health department is, you can check in your phone book. The CDC also has a tool to help you locate your state's health department, and many state websites also have listings of all of their local health departments.  3) Find a [facility] If your illness is not likely to be very severe or complicated, you may want to try a walk in clinic. These are popping up all over the country in pharmacies, drugstores, and shopping centers. They're usually staffed by nurse practitioners or physician assistants that have been trained to treat common illnesses and complaints. They're usually fairly quick and inexpensive. However, if you have serious medical issues or chronic medical problems, these are probably not your best  option.  No Primary Care Doctor: - Call Health Connect at  [phone] - they can help you locate a primary care doctor that  accepts your insurance, provides certain services, etc. - Physician Referral Service- [phone]  Chronic Pain Problems: Organization         Address  Phone   Notes  [facility]  [phone] Patients need to be referred by their primary care doctor.   Medication Assistance: Organization         Address  Phone   Notes  [facility] Medication [facility] [address] [place], [place] [address] [phone] --Must be a resident of [facility] -- Must have NO insurance coverage whatsoever (no Medicaid/ Medicare, etc.) -- The pt. MUST have a primary care doctor that directs their care regularly and follows them in the community   [facility]  [phone]   [employer]  [phone]    Agencies that provide inexpensive medical care: Organization         Address  Phone   Notes  [place]  [phone]   [facility]    [phone]   [facility] [place], [place] [address] [phone]   [place] [address] [place] [phone]   Planned Parenthood    [phone]   [facility]    [phone]   [place] and [place],  [place] Phone:  [phone], Fax:  [phone] Hours of Operation:  9 am - 6 pm, M-F.  Also accepts Medicaid/Medicare and self-pay.  [facility] for [place], [address], [place] Phone: [phone], Fax: [phone]. Hours of Operation:  8:30 am - 5:30 pm, M-F.  Also accepts Medicaid and self-pay.  [facility] [place] [address] [place] Phone: [phone]   [place], [place], [place] [phone], Ext. [phone] Mondays & Thursdays: 7-9 AM.  First 15  patients are seen on a first come, first serve basis.    [place] Providers:  Organization         Address  Phone   Notes  [facility] [address],  [phone] Also accepts self-pay patients.  [facility] [id number] [place], [place]  [phone]   [place], [address], [place] [phone]   [facility] [address] [place] [phone]   Lucianne Lei [address], [place]   [phone] Only accepts [place] Access [place] patients after they have their name applied to their card.   Self-Pay (no insurance) in [facility]:  Organization         Address  Phone   Notes  Sickle Cell Patients, [facility] Internal Medicine [place] [phone]   [facility] [place] [phone]   [facility] [employer] [facility]  [place], [place], [place] [phone]   [facility]/Dr. Osei-Bonsu  [address], [place] or [address], [place] [phone] Phone number for both [place] and [place] locations is the same.  [facility] [address], [place] [phone]   [facility] [address], [place] or [address] [phone]   [facility] [address] [place] [phone], phone; [phone], fax Sees patients 1st and 3rd Saturday of every month.  Must not qualify for public or private insurance (i.e. Medicaid, Medicare, [place] Health Choice, Veterans' Benefits)  Household income should be no more than 200% of the poverty level The clinic cannot treat you if you are pregnant or think you are pregnant  Sexually transmitted diseases are not treated at the clinic.    Dental  Care: Organization         Address  Phone  Notes  [facility] Department of [facility] [place] [phone] Accepts children up to age 58 who are enrolled in [place] or [place]; pregnant women with a Medicaid card; and children who have applied for Medicaid or [place] Health Choice, but were declined, whose parents can pay a reduced fee at time of service.  [facility] Department of [facility]  [address], [place] [phone] Accepts children up to age 68 who are enrolled in [place] or [place]; pregnant women with a Medicaid card; and children who have applied for Medicaid or [place] Health Choice, but were declined, whose parents can pay a reduced fee at time of service.  [place] Adult Dental Access PROGRAM  [place] [phone] Patients are seen by  appointment only. Walk-ins are not accepted. [place] will see patients 83 years of age and older. Monday - Tuesday (8am-5pm) Most Wednesdays (8:30-5pm) $30 per visit, cash only  [facility] Adult Dental Access PROGRAM  [address], [facility] [phone] Patients are seen by appointment only. Walk-ins are not accepted. [place] will see patients 27 years of age and older. One Wednesday Evening (Monthly: Volunteer Based).  $30 per visit, cash only  [place]  [phone] for adults; Children under age 22, call Graduate Pediatric Dentistry at [phone]. Children aged 73-14, please call [phone] to request a pediatric application.  Dental services are provided in all areas of dental care including fillings, crowns and bridges, complete and partial dentures, implants, gum treatment, root canals, and extractions. Preventive care is also provided. Treatment is provided to both adults and children. Patients are selected via a lottery and there is often a waiting list.   [facility] [address] [place]  [phone] [email or web address]   [facility] [address] [place], [place] [phone], Ext. [phone] Second and Fourth Thursday of each month, opens at 6:30 AM; Clinic ends at 9 AM.  Patients are seen on a first-come first-served basis, and a limited number are seen during each clinic.   [facility]  [address] Hillard Danker [place], [place] [phone]   Eligibility Requirements You must have lived in [place], [place], or [place] counties for at least the last three months.   You cannot be eligible for state or federal sponsored [employer], including [employer], [place], or [employer].   You generally cannot be eligible for healthcare insurance through your employer.    How to apply: Eligibility screenings are held every Tuesday and Wednesday afternoon from 1:00 pm until 4:00 pm. You do not need an appointment for the interview!  [facility] [address] [place], [place]   [place]  [place] Department  [place]  [phone]    Behavioral Health Resources in the Community: Intensive Outpatient Programs Organization         Address  Phone  Notes  [place]. [address] [place], [place] [phone]   [facility] [address], [place], [place]   ADS: Alcohol & Drug Svcs [address] [place], [place]   [place] [address],  [place], [place] or [phone]   Substance Abuse Resources Organization         Address  Phone  Notes  Al[facility] Drug Services  [phone]   [place]  [phone]   [facility] [place]   Chinita Pester  [phone]   Residential & Outpatient Substance Abuse Program  [phone]    Psychological Services Organization         Address  Phone  Notes  [facility] [place]  [place]  [phone]   [place] [address] [place] [phone] or [phone]    Mobile Crisis Teams Organization         Address  Phone  Notes  [facility], Mobile Crisis Care Unit  [phone]   Assertive Psychotherapeutic Services  [address] [place], [place]   [facility] [address] [place] [phone]  Self-Help/Support Groups Organization         Address  Phone             Notes  [facility]ental [facility] of [place] - variety of support groups  [place] Call for more information  Narcotics Anonymous (NA), [facility] [address], [employer] [place]  2 meetings at this location   Special educational needs teacher         Address  Phone  Notes  [facility] [place],    [place]  [phone]   [facility]  [address] [place] [phone], [place], [place]   [place], [place] [phone] Admissions: 8am-3pm M-F  Incentives Substance [place] [address]    [place], [place] [phone]   [facility] [address] [place], [place], [place]   [facility] [address],  [place], [place]   [facility] - Intensive Outpatient [address]een [address], [place], [place]   [facility] [address]  [place], [place] [phone] or [phone]   [facility] (RT[facility]) [address] [place], [place] Accepts Medicaid  [facility] [place] [address],  [place] [phone] Substance Abuse/Addiction Treatment   [facility] Organization         Address  Phone  Notes  [facility]  [phone]   Domenic Schwab, PhD [address] Arlis Porta [place], [place]   [phone] or [phone]   [place], [place] [phone]   [facility] [address] [place], [place] [phone] Insurance/Medicaid/sponsorship through [facility] and Families [address] [place]                                    [place], [place] [phone] [place] [address], [place] [phone]    Dr. Adele Schilder  [phone]   [facility] [place] Dept. 1) [address], [place] 2) [place] 3)  [place] [address], [place] [phone]  [phone]   [place] [phone] or [phone] (After Hours)

## 2015-05-13 ENCOUNTER — Emergency Department (HOSPITAL_COMMUNITY): Payer: PRIVATE HEALTH INSURANCE

## 2015-05-13 ENCOUNTER — Emergency Department (HOSPITAL_COMMUNITY)
Admission: EM | Admit: 2015-05-13 | Discharge: 2015-05-13 | Disposition: A | Discharge status: Home / Self Care | Payer: PRIVATE HEALTH INSURANCE | Attending: Emergency Medicine | Admitting: Emergency Medicine

## 2015-05-13 ENCOUNTER — Encounter (HOSPITAL_COMMUNITY): Payer: Self-pay | Admitting: Family Medicine

## 2015-05-13 DIAGNOSIS — Z91040 Latex allergy status: Secondary | ICD-10-CM | POA: Insufficient documentation

## 2015-05-13 DIAGNOSIS — Z8659 Personal history of other mental and behavioral disorders: Secondary | ICD-10-CM | POA: Insufficient documentation

## 2015-05-13 DIAGNOSIS — Z79899 Other long term (current) drug therapy: Secondary | ICD-10-CM | POA: Insufficient documentation

## 2015-05-13 DIAGNOSIS — Z859 Personal history of malignant neoplasm, unspecified: Secondary | ICD-10-CM | POA: Insufficient documentation

## 2015-05-13 DIAGNOSIS — Z880 Allergy status to penicillin: Secondary | ICD-10-CM | POA: Insufficient documentation

## 2015-05-13 DIAGNOSIS — Z8679 Personal history of other diseases of the circulatory system: Secondary | ICD-10-CM | POA: Insufficient documentation

## 2015-05-13 DIAGNOSIS — R05 Cough: Secondary | ICD-10-CM

## 2015-05-13 DIAGNOSIS — J111 Influenza due to unidentified influenza virus with other respiratory manifestations: Secondary | ICD-10-CM | POA: Insufficient documentation

## 2015-05-13 DIAGNOSIS — J45909 Unspecified asthma, uncomplicated: Secondary | ICD-10-CM | POA: Insufficient documentation

## 2015-05-13 DIAGNOSIS — R059 Cough, unspecified: Secondary | ICD-10-CM

## 2015-05-13 NOTE — Discharge Instructions (Signed)
Get plenty of rest, and drink a lot of fluids. Use Tylenol, or Motrin as needed for fever and pain. Use Robitussin-DM for cough.   Influenza, Adult Influenza ("the flu") is a viral infection of the respiratory tract. It occurs more often in winter months because people spend more time in close contact with one another. Influenza can make you feel very sick. Influenza easily spreads from person to person (contagious). CAUSES  Influenza is caused by a virus that infects the respiratory tract. You can catch the virus by breathing in droplets from an infected person's cough or sneeze. You can also catch the virus by touching something that was recently contaminated with the virus and then touching your mouth, nose, or eyes. RISKS AND COMPLICATIONS You may be at risk for a more severe case of influenza if you smoke cigarettes, have diabetes, have chronic heart disease (such as heart failure) or lung disease (such as asthma), or if you have a weakened immune system. Elderly people and pregnant women are also at risk for more serious infections. The most common problem of influenza is a lung infection (pneumonia). Sometimes, this problem can require emergency medical care and may be life threatening. SIGNS AND SYMPTOMS  Symptoms typically last 4 to 10 days and may include:  Fever.  Chills.  Headache, body aches, and muscle aches.  Sore throat.  Chest discomfort and cough.  Poor appetite.  Weakness or feeling tired.  Dizziness.  Nausea or vomiting. DIAGNOSIS  Diagnosis of influenza is often made based on your history and a physical exam. A nose or throat swab test can be done to confirm the diagnosis. TREATMENT  In mild cases, influenza goes away on its own. Treatment is directed at relieving symptoms. For more severe cases, your health care provider may prescribe antiviral medicines to shorten the sickness. Antibiotic medicines are not effective because the infection is caused by a virus,  not by bacteria. HOME CARE INSTRUCTIONS  Take medicines only as directed by your health care provider.  Use a cool mist humidifier to make breathing easier.  Get plenty of rest until your temperature returns to normal. This usually takes 3 to 4 days.  Drink enough fluid to keep your urine clear or pale yellow.  Cover yourmouth and nosewhen coughing or sneezing,and wash your handswellto prevent thevirusfrom spreading.  Stay homefromwork orschool untilthe fever is gonefor at least 74full day. PREVENTION  An annual influenza vaccination (flu shot) is the best way to avoid getting influenza. An annual flu shot is now routinely recommended for all adults in the [place] IF:  You experiencechest pain, yourcough worsens,or you producemore mucus.  Youhave nausea,vomiting, ordiarrhea.  Your fever returns or gets worse. SEEK IMMEDIATE MEDICAL CARE IF:  You havetrouble breathing, you become short of breath,or your skin ornails becomebluish.  You have severe painor stiffnessin the neck.  You develop a sudden headache, or pain in the face or ear.  You have nausea or vomiting that you cannot control. MAKE SURE YOU:   Understand these instructions.  Will watch your condition.  Will get help right away if you are not doing well or get worse.   This information is not intended to replace advice given to you by your health care provider. Make sure you discuss any questions you have with your health care provider.   Document Released: 02/23/2000 Document Revised: 03/18/2014 Document Reviewed: 05/27/2011 Elsevier Interactive Patient Education 2016 Elsevier Inc.  Cough, Adult A cough helps to clear your  throat and lungs. A cough may last only 2-3 weeks (acute), or it may last longer than 8 weeks (chronic). Many different things can cause a cough. A cough may be a sign of an illness or another medical condition. HOME CARE  Pay attention to any  changes in your cough.  Take medicines only as told by your doctor.  If you were prescribed an antibiotic medicine, take it as told by your doctor. Do not stop taking it even if you start to feel better.  Talk with your doctor before you try using a cough medicine.  Drink enough fluid to keep your pee (urine) clear or pale yellow.  If the air is dry, use a cold steam vaporizer or humidifier in your home.  Stay away from things that make you cough at work or at home.  If your cough is worse at night, try using extra pillows to raise your head up higher while you sleep.  Do not smoke, and try not to be around smoke. If you need help quitting, ask your doctor.  Do not have caffeine.  Do not drink alcohol.  Rest as needed. GET HELP IF:  You have new problems (symptoms).  You cough up yellow fluid (pus).  Your cough does not get better after 2-3 weeks, or your cough gets worse.  Medicine does not help your cough and you are not sleeping well.  You have pain that gets worse or pain that is not helped with medicine.  You have a fever.  You are losing weight and you do not know why.  You have night sweats. GET HELP RIGHT AWAY IF:  You cough up blood.  You have trouble breathing.  Your heartbeat is very fast.   This information is not intended to replace advice given to you by your health care provider. Make sure you discuss any questions you have with your health care provider.   Document Released: 11/08/2010 Document Revised: 11/16/2014 Document Reviewed: 05/04/2014 Elsevier Interactive Patient Education [employer].

## 2015-05-13 NOTE — ED Provider Notes (Signed)
CSN: [id number]     Arrival date & time 05/13/15  [id number] History   First MD Initiated Contact with Patient 05/13/15 [phone]     Chief Complaint  Patient presents with  . Cough  . Fever  . Headache     (Consider location/radiation/quality/duration/timing/severity/associated sxs/prior Treatment) HPI   Colleen Mccullough is a 45 y.o. female who presents for evaluation of cough, fever, rhinorrhea or sore throat and myalgia, present for 4 days. She denies weakness or dizziness. There are no other no modifying factors.   Past Medical History  Diagnosis Date  . Bipolar 1 disorder ([place])   . Hyperglycemia   . Migraines   . Asthma   . Depression   . ADD (attention deficit disorder)   . Cancer ([place])   . Hypoglycemia    Past Surgical History  Procedure Laterality Date  . Tubal ligation    . Cesarean section     Family History  Problem Relation Age of Onset  . Hypertension Father    Social History  Substance Use Topics  . Smoking status: Never Smoker   . Smokeless tobacco: Never Used  . Alcohol Use: No   OB History    No data available     Review of Systems  All other systems reviewed and are negative.     Allergies  Amoxicillin; Darvocet; Latex; Percocet; and Promethazine hcl  Home Medications   Prior to Admission medications   Medication Sig Start Date End Date Taking? Authorizing Provider  BIOTIN PO Take 1 tablet by mouth daily.   Yes Historical Provider, MD  Cyanocobalamin (VITAMIN B 12) 100 MCG LOZG Take 100 mcg by mouth daily.   Yes Historical Provider, MD  ibuprofen (ADVIL,MOTRIN) 800 MG tablet Take 1 tablet (800 mg total) by mouth every 8 (eight) hours as needed. 01/24/15  Yes Dalia Heading, PA-C  Melatonin 1 MG CAPS Take 1 mg by mouth at bedtime.   Yes Historical Provider, MD  Albuterol Sulfate (PROAIR RESPICLICK) [id number] (90 BASE) MCG/ACT AEPB Inhale 2 puffs into the lungs every 6 (six) hours as needed. Patient not taking: Reported on 10/25/2014 03/23/14    Darreld Mclean, MD  benzonatate (TESSALON) 100 MG capsule Take 2 capsules (200 mg total) by mouth 2 (two) times daily as needed for cough. Patient not taking: Reported on 05/13/2015 02/13/15   Nona Dell, PA-C  clonazePAM (KLONOPIN) 0.5 MG tablet Take 0.5-1 tablets (0.25-0.5 mg total) by mouth 2 (two) times daily as needed for anxiety. Patient not taking: Reported on 05/13/2015 03/26/14   Shawnee Knapp, MD  ergocalciferol (VITAMIN D2) 50000 UNITS capsule Take 1 capsule (50,000 Units total) by mouth once a week. Patient not taking: Reported on 10/25/2014 03/28/14   Shawnee Knapp, MD  HYDROcodone-acetaminophen (NORCO/VICODIN) 5-325 MG tablet Take 1 tablet by mouth every 6 (six) hours as needed for moderate pain. Patient not taking: Reported on 05/13/2015 01/24/15   Dalia Heading, PA-C  predniSONE (DELTASONE) 50 MG tablet Take 1 tablet (50 mg total) by mouth daily with breakfast. Patient not taking: Reported on 05/13/2015 01/24/15   Dalia Heading, PA-C  predniSONE (DELTASONE) 50 MG tablet Take 1 tablet (50 mg total) by mouth daily with breakfast. Patient not taking: Reported on 05/13/2015 01/24/15   Dalia Heading, PA-C  pseudoephedrine (SUDAFED) 60 MG tablet Take 1 tablet (60 mg total) by mouth every 6 (six) hours as needed for congestion. Patient not taking: Reported on 05/13/2015 02/13/15   Nona Dell, PA-C  BP 101/70 mmHg  Pulse 113  Temp(Src) 100 F (37.8 C) (Oral)  SpO2 99%  LMP 04/19/2015 Physical Exam  Constitutional: She is oriented to person, place, and time. She appears well-developed and well-nourished. She appears distressed (She is uncomfortable).  HENT:  Head: Normocephalic and atraumatic.  Right Ear: External ear normal.  Left Ear: External ear normal.  Mouth/Throat: Oropharynx is clear and moist. No oropharyngeal exudate.  Airway intact. No pharyngeal erythema or swelling.  Eyes: Conjunctivae and EOM are normal. Pupils are equal, round, and reactive to  light.  Neck: Normal range of motion and phonation normal. Neck supple.  Cardiovascular: Normal rate, regular rhythm and normal heart sounds.   Pulmonary/Chest: Effort normal and breath sounds normal. No respiratory distress. She has no wheezes. She exhibits no bony tenderness.  Occasional nonproductive cough.  Abdominal: Soft. There is no tenderness.  Musculoskeletal: Normal range of motion.  Neurological: She is alert and oriented to person, place, and time. No cranial nerve deficit or sensory deficit. She exhibits normal muscle tone. Coordination normal.  Skin: Skin is warm, dry and intact.  Psychiatric: She has a normal mood and affect. Her behavior is normal. Judgment and thought content normal.  Nursing note and vitals reviewed.   ED Course  Procedures (including critical care time)  Medications - No data to display  Patient Vitals for the past 24 hrs:  BP Temp Temp src Pulse SpO2  05/13/15 0903 101/70 mmHg - - - -  05/13/15 0857 (!) 89/71 mmHg 100 F (37.8 C) Oral 113 99 %    10:17 AM Reevaluation with update and discussion. After initial assessment and treatment, an updated evaluation reveals Clinical evaluation unchanged. Findings discussed with patient, all questions answered. [place] Review Labs Reviewed - No data to display  Imaging Review Dg Chest 2 View  05/13/2015  CLINICAL DATA:  Cough, fever, nausea and headache for 4 days, sore all over especially under jaw and at throat, history asthma EXAM: CHEST  2 VIEW COMPARISON:  10/25/2014 FINDINGS: Normal heart size, mediastinal contours, and pulmonary vascularity. Minimal chronic bronchitic changes. Lungs otherwise clear. No pleural effusion or pneumothorax. Bones unremarkable. IMPRESSION: No acute abnormalities. Minimal chronic bronchitic changes. Electronically Signed   By: Lavonia Dana M.D.   On: 05/13/2015 09:56   I have personally reviewed and evaluated these images and lab results as part of my medical  decision-making.   EKG Interpretation   Date/Time:  Saturday May 13 2015 09:03:04 EST Ventricular Rate:  106 PR Interval:  122 QRS Duration: 66 QT Interval:  324 QTC Calculation: 430 R Axis:   50 Text Interpretation:  Sinus tachycardia Otherwise normal ECG Since last  tracing rate faster Confirmed by Orlo Brickle  MD, Jermiah Howton [id number]) on 05/13/2015  9:55:18 AM      MDM   Final diagnoses:  Influenza    Evaluation consistent with seasonal influenza. Doubt pneumonia, PE or ACS. She is low risk for complications.  Nursing Notes Reviewed/ Care Coordinated Applicable Imaging Reviewed Interpretation of Laboratory Data incorporated into ED treatment  The patient appears reasonably screened and/or stabilized for discharge and I doubt any other medical condition or other [facility] requiring further screening, evaluation, or treatment in the ED at this time prior to discharge.  Plan: Home Medications- OTC prn; Home Treatments- rest, fluids; return here if the recommended treatment, does not improve the symptoms; Recommended follow up- PCP prn     Daleen Bo, MD 05/13/15 1018

## 2015-05-13 NOTE — ED Notes (Signed)
Pt here for cough, fever, nausea, headache x 4 days.

## 2016-01-16 ENCOUNTER — Emergency Department (HOSPITAL_COMMUNITY)
Admission: EM | Admit: 2016-01-16 | Discharge: 2016-01-16 | Disposition: A | Discharge status: Home / Self Care | Payer: No Typology Code available for payment source | Attending: Emergency Medicine | Admitting: Emergency Medicine

## 2016-01-16 ENCOUNTER — Encounter (HOSPITAL_COMMUNITY): Payer: Self-pay | Admitting: *Deleted

## 2016-01-16 DIAGNOSIS — B349 Viral infection, unspecified: Secondary | ICD-10-CM | POA: Insufficient documentation

## 2016-01-16 DIAGNOSIS — Z859 Personal history of malignant neoplasm, unspecified: Secondary | ICD-10-CM | POA: Insufficient documentation

## 2016-01-16 DIAGNOSIS — Z91040 Latex allergy status: Secondary | ICD-10-CM | POA: Insufficient documentation

## 2016-01-16 DIAGNOSIS — R059 Cough, unspecified: Secondary | ICD-10-CM

## 2016-01-16 DIAGNOSIS — R05 Cough: Secondary | ICD-10-CM

## 2016-01-16 DIAGNOSIS — J45909 Unspecified asthma, uncomplicated: Secondary | ICD-10-CM | POA: Insufficient documentation

## 2016-01-16 MED ORDER — ONDANSETRON 8 MG PO TBDP
8.0000 mg | ORAL_TABLET | Freq: Once | ORAL | Status: AC
  Administered 2016-01-16: 8 mg via ORAL
  Filled 2016-01-16: qty 1

## 2016-01-16 MED ORDER — ALBUTEROL SULFATE HFA 108 (90 BASE) MCG/ACT IN AERS
2.0000 | INHALATION_SPRAY | Freq: Once | RESPIRATORY_TRACT | Status: AC
  Administered 2016-01-16: 2 via RESPIRATORY_TRACT
  Filled 2016-01-16: qty 6.7

## 2016-01-16 MED ORDER — PREDNISONE 20 MG PO TABS: 60.0000 mg | ORAL_TABLET | Freq: Every day | ORAL | 0 refills | Status: DC

## 2016-01-16 MED ORDER — ACETAMINOPHEN 325 MG PO TABS
650.0000 mg | ORAL_TABLET | Freq: Once | ORAL | Status: AC
  Administered 2016-01-16: 650 mg via ORAL
  Filled 2016-01-16: qty 2

## 2016-01-16 MED ORDER — BENZONATATE 100 MG PO CAPS: 100.0000 mg | ORAL_CAPSULE | Freq: Three times a day (TID) | ORAL | 0 refills | Status: DC | PRN

## 2016-01-16 NOTE — ED Triage Notes (Signed)
Pt here with complaint of aching, back/chest pain, weak, cough.  Wanting to be checked out with her daughter.

## 2016-01-16 NOTE — ED Provider Notes (Signed)
[facility] DEPT Provider Note   CSN: [id number] Arrival date & time: 01/16/16  [id number]     History   Chief Complaint Chief Complaint  Patient presents with  . Cough    HPI Colleen Mccullough is a 45 y.o. female.  Patient is 45 yo F with history of ADD, bipolar disorder, and asthma, presenting with subjective fevers, body aches, and nonproductive cough x 2 days. Presenting with daughter who has similar symptoms, and "just wanted to get checked out." Also requesting note for work today. She has mild chest tenderness made worse with cough, but denies any sore throat, chest pain, or shortness of breath. She reports history of "bronchitis," and states it feels similar. She denies any wheezing, chest tightness, or asthma exacerbation. Reports mild nausea, but no abdominal pain, vomiting, diarrhea, or dysuria. She has been eating/drinking without difficulty. She does not smoke cigarettes.        Past Medical History:  Diagnosis Date  . ADD (attention deficit disorder)   . Asthma   . Bipolar 1 disorder ([place])   . Cancer ([place])   . Depression   . Hyperglycemia   . Hypoglycemia   . Migraines     Patient Active Problem List   Diagnosis Date Noted  . Vitamin D deficiency 03/28/2014  . Bipolar 1 disorder, mixed, moderate ([place]) 03/23/2014  . Asthma 03/23/2014    Past Surgical History:  Procedure Laterality Date  . CESAREAN SECTION    . TUBAL LIGATION      OB History    No data available       Home Medications    Prior to Admission medications   Medication Sig Start Date End Date Taking? Authorizing Provider  Albuterol Sulfate (PROAIR RESPICLICK) [id number] (90 BASE) MCG/ACT AEPB Inhale 2 puffs into the lungs every 6 (six) hours as needed. Patient not taking: Reported on 10/25/2014 03/23/14   Darreld Mclean, MD  benzonatate (TESSALON) 100 MG capsule Take 2 capsules (200 mg total) by mouth 2 (two) times daily as needed for cough. Patient not taking: Reported on 05/13/2015  02/13/15   Nona Dell, PA-C  BIOTIN PO Take 1 tablet by mouth daily.    Historical Provider, MD  clonazePAM (KLONOPIN) 0.5 MG tablet Take 0.5-1 tablets (0.25-0.5 mg total) by mouth 2 (two) times daily as needed for anxiety. Patient not taking: Reported on 05/13/2015 03/26/14   Shawnee Knapp, MD  Cyanocobalamin (VITAMIN B 12) 100 MCG LOZG Take 100 mcg by mouth daily.    Historical Provider, MD  ergocalciferol (VITAMIN D2) 50000 UNITS capsule Take 1 capsule (50,000 Units total) by mouth once a week. Patient not taking: Reported on 10/25/2014 03/28/14   Shawnee Knapp, MD  HYDROcodone-acetaminophen (NORCO/VICODIN) 5-325 MG tablet Take 1 tablet by mouth every 6 (six) hours as needed for moderate pain. Patient not taking: Reported on 05/13/2015 01/24/15   Dalia Heading, PA-C  ibuprofen (ADVIL,MOTRIN) 800 MG tablet Take 1 tablet (800 mg total) by mouth every 8 (eight) hours as needed. 01/24/15   Dalia Heading, PA-C  Melatonin 1 MG CAPS Take 1 mg by mouth at bedtime.    Historical Provider, MD  predniSONE (DELTASONE) 50 MG tablet Take 1 tablet (50 mg total) by mouth daily with breakfast. Patient not taking: Reported on 05/13/2015 01/24/15   Dalia Heading, PA-C  predniSONE (DELTASONE) 50 MG tablet Take 1 tablet (50 mg total) by mouth daily with breakfast. Patient not taking: Reported on 05/13/2015 01/24/15   Dalia Heading, PA-C  pseudoephedrine (SUDAFED) 60 MG tablet Take 1 tablet (60 mg total) by mouth every 6 (six) hours as needed for congestion. Patient not taking: Reported on 05/13/2015 02/13/15   Nona Dell, PA-C    Family History Family History  Problem Relation Age of Onset  . Hypertension Father     Social History Social History  Substance Use Topics  . Smoking status: Never Smoker  . Smokeless tobacco: Never Used  . Alcohol use No     Allergies   Amoxicillin; Darvocet [propoxyphene n-acetaminophen]; Latex; Percocet [oxycodone-acetaminophen]; and  Promethazine hcl   Review of Systems Review of Systems  Constitutional: Positive for fever.  HENT: Negative for congestion, ear pain, sore throat, trouble swallowing and voice change.   Eyes: Negative for pain and visual disturbance.  Respiratory: Positive for cough. Negative for chest tightness, shortness of breath and wheezing.   Cardiovascular: Negative for chest pain, palpitations and leg swelling.  Gastrointestinal: Positive for nausea. Negative for abdominal pain, blood in stool, diarrhea and vomiting.  Genitourinary: Negative for dysuria, flank pain and hematuria.  Musculoskeletal: Positive for myalgias. Negative for back pain and neck pain.  Skin: Negative for color change and rash.  Neurological: Negative for dizziness, seizures, syncope, weakness, numbness and headaches.     Physical Exam Updated Vital Signs BP 112/66 (BP Location: Right Arm)   Pulse 98   Temp 98.2 F (36.8 C) (Oral)   Resp 16   Ht 5\' 2"  (1.575 m)   Wt 58.5 kg   LMP 12/25/2015   SpO2 99%   BMI 23.59 kg/m   Physical Exam  Constitutional: She appears well-developed and well-nourished. No distress.  HENT:  Head: Normocephalic and atraumatic.  Mouth/Throat: Oropharynx is clear and moist.  TMs and ear canals normal bilaterally. No nasal mucosal edema or erythema. No frontal or maxillary sinus tenderness. Uvula midline, no trismus. Dentition normal, no dental abscess or oral lesions. No oropharyngeal exudate, erythema, or edema. No tonsillar abscess or exudate.  Eyes: Conjunctivae are normal.  Neck: Normal range of motion. Neck supple.  No nuchal rigidity or meningeal signs.  Cardiovascular: Normal rate, regular rhythm, normal heart sounds and intact distal pulses.   Pulmonary/Chest: Effort normal. No respiratory distress. She has wheezes. She has no rales. She exhibits tenderness (mild TTP of chest wall).  Very faint expiratory wheezes noted on forced exhalation.  Abdominal: Soft. She exhibits  no distension. There is no tenderness. There is no guarding.  Musculoskeletal: Normal range of motion. She exhibits no edema or tenderness.  Lymphadenopathy:    She has no cervical adenopathy.  Neurological: She is alert.  Skin: Skin is warm and dry.  Psychiatric: She has a normal mood and affect.  Nursing note and vitals reviewed.    ED Treatments / Results  Labs (all labs ordered are listed, but only abnormal results are displayed) Labs Reviewed - No data to display  EKG  EKG Interpretation None       Radiology No results found.  Procedures Procedures (including critical care time)  Medications Ordered in ED Medications  albuterol (PROVENTIL HFA;VENTOLIN HFA) 108 (90 Base) MCG/ACT inhaler 2 puff (2 puffs Inhalation Given 01/16/16 0918)  ondansetron (ZOFRAN-ODT) disintegrating tablet 8 mg (8 mg Oral Given 01/16/16 0929)  acetaminophen (TYLENOL) tablet 650 mg (650 mg Oral Given 01/16/16 0929)     Initial Impression / Assessment and Plan / ED Course  I have reviewed the triage vital signs and the nursing notes.  Pertinent labs & imaging results that  were available during my care of the patient were reviewed by me and considered in my medical decision making (see chart for details).  Clinical Course    Patient is 45 yo F presenting with subjective fevers, body aches, and nonproductive cough. She has a history of asthma and bronchitis, with very faint expiratory wheezes, but no fever, rales, or chest pain warranting CXR. Patient given albuterol inhaler, which improved lung sounds. Patient also describes nausea and muscle aches, which resolved after Zofran ODT and Tylenol. Given that her daughter presented with similar mild symptoms, she likely has a virus. She was given albuterol MDI and prescription for oral prednisone to minimize risk of asthma exacerbation, and stable for d/c home. Note for work provided, and advised to f/u with PCP as needed for reevaluation.  Final  Clinical Impressions(s) / ED Diagnoses   Final diagnoses:  Viral syndrome  Cough    New Prescriptions Discharge Medication List as of 01/16/2016 11:20 AM    START taking these medications   Details  benzonatate (TESSALON) 100 MG capsule Take 1 capsule (100 mg total) by mouth 3 (three) times daily as needed for cough., Starting Tue 01/16/2016, Print    predniSONE (DELTASONE) 20 MG tablet Take 3 tablets (60 mg total) by mouth daily., Starting Tue 01/16/2016, Print         Shawnae Leiva F [place], [place] 01/16/16 [id number]    Carmin Muskrat, MD 01/16/16 [phone]

## 2016-01-16 NOTE — Discharge Instructions (Signed)
You likely have a virus causing your cough. Given your history of asthma, please take the oral Prednisone for 5 days as prescribed and your albuterol inhaler as needed to minimize any asthma exacerbation. Also, please follow up with your PCP as needed for reevaluation. You are excused from work today, but ok to go back tomorrow.

## 2016-04-28 ENCOUNTER — Emergency Department (HOSPITAL_COMMUNITY): Payer: Self-pay

## 2016-04-28 ENCOUNTER — Encounter (HOSPITAL_COMMUNITY): Payer: Self-pay | Admitting: Emergency Medicine

## 2016-04-28 ENCOUNTER — Emergency Department (HOSPITAL_COMMUNITY)
Admission: EM | Admit: 2016-04-28 | Discharge: 2016-04-28 | Disposition: A | Discharge status: Home / Self Care | Payer: Self-pay | Attending: Emergency Medicine | Admitting: Emergency Medicine

## 2016-04-28 DIAGNOSIS — M25552 Pain in left hip: Secondary | ICD-10-CM | POA: Insufficient documentation

## 2016-04-28 DIAGNOSIS — Z91040 Latex allergy status: Secondary | ICD-10-CM | POA: Insufficient documentation

## 2016-04-28 DIAGNOSIS — J45909 Unspecified asthma, uncomplicated: Secondary | ICD-10-CM | POA: Insufficient documentation

## 2016-04-28 DIAGNOSIS — Z859 Personal history of malignant neoplasm, unspecified: Secondary | ICD-10-CM | POA: Insufficient documentation

## 2016-04-28 DIAGNOSIS — Y999 Unspecified external cause status: Secondary | ICD-10-CM | POA: Insufficient documentation

## 2016-04-28 DIAGNOSIS — W19XXXA Unspecified fall, initial encounter: Secondary | ICD-10-CM

## 2016-04-28 DIAGNOSIS — W010XXA Fall on same level from slipping, tripping and stumbling without subsequent striking against object, initial encounter: Secondary | ICD-10-CM | POA: Insufficient documentation

## 2016-04-28 DIAGNOSIS — Y929 Unspecified place or not applicable: Secondary | ICD-10-CM | POA: Insufficient documentation

## 2016-04-28 DIAGNOSIS — Y939 Activity, unspecified: Secondary | ICD-10-CM | POA: Insufficient documentation

## 2016-04-28 DIAGNOSIS — Z79899 Other long term (current) drug therapy: Secondary | ICD-10-CM | POA: Insufficient documentation

## 2016-04-28 MED ORDER — NAPROXEN 500 MG PO TABS: 500.0000 mg | ORAL_TABLET | Freq: Two times a day (BID) | ORAL | 0 refills | Status: DC

## 2016-04-28 MED ORDER — LIDOCAINE 5 % EX PTCH: 1.0000 | MEDICATED_PATCH | CUTANEOUS | 0 refills | Status: DC

## 2016-04-28 MED ORDER — IBUPROFEN 400 MG PO TABS
400.0000 mg | ORAL_TABLET | Freq: Once | ORAL | Status: AC | PRN
  Administered 2016-04-28: 400 mg via ORAL
  Filled 2016-04-28: qty 1

## 2016-04-28 MED ORDER — METHOCARBAMOL 500 MG PO TABS: 500.0000 mg | ORAL_TABLET | Freq: Two times a day (BID) | ORAL | 0 refills | Status: DC

## 2016-04-28 NOTE — ED Triage Notes (Signed)
Pt. Stated, I fell a month ago on my left side and my left hip is still hurting into my lower back.

## 2016-04-28 NOTE — Discharge Instructions (Signed)
Your CT scan of lumbar spine and left hip were normal today.  Your pain is most likely due to muscular or soft tissue inflammation.   Please take naproxen (anti-inflammatory and anti-pain), robaxin (muscle relaxer) and place lidocaine patch on affected area for additional pain control.  Robaxin is on the [facility] $4 prescription list. See coupon for robaxin.  There was no available coupon for lidocaine patches.  Follow up with your primary care provider for additional work up and treatment as needed.  If you do not already have a primary care provider you may contact [facility] for follow up, this clinic accepts patients without medical insurance.

## 2016-04-28 NOTE — ED Provider Notes (Signed)
[facility] DEPT Provider Note   CSN: [id number] Arrival date & time: 04/28/16  [id number]     History   Chief Complaint Chief Complaint  Patient presents with  . Fall  . Hip Pain    HPI Colleen Mccullough is a 46 y.o. female presents with left lower back constant 10/10 pain with radiation to left lateral hip and posterior thigh s/p work place mechanical fall 1 month ago.  Patient states she tripped and fell 3-4 steps landing on her left lower back.  She denies LOC or head injury, she was ambulatory immediately after fall and for the past month. Aggravating symptoms include laying on her left side, walking or laying down for prolonged periods of time. No alleviating factors.  Patient states she has not tried any medications as she does not like pills.  She states she does not want narcotic pain medicines.  No associated bladder incontinence or retention, lower extremity n/w/t, abdominal pain, urinary symptoms or vaginal bleeding/discharge.  No previous back injuries or surgeries.   HPI  Past Medical History:  Diagnosis Date  . ADD (attention deficit disorder)   . Asthma   . Bipolar 1 disorder ([place])   . Cancer ([place])   . Depression   . Hyperglycemia   . Hypoglycemia   . Migraines     Patient Active Problem List   Diagnosis Date Noted  . Vitamin D deficiency 03/28/2014  . Bipolar 1 disorder, mixed, moderate ([place]) 03/23/2014  . Asthma 03/23/2014    Past Surgical History:  Procedure Laterality Date  . CESAREAN SECTION    . TUBAL LIGATION      OB History    No data available       Home Medications    Prior to Admission medications   Medication Sig Start Date End Date Taking? Authorizing Provider  Albuterol Sulfate (PROAIR RESPICLICK) [id number] (90 BASE) MCG/ACT AEPB Inhale 2 puffs into the lungs every 6 (six) hours as needed. Patient taking differently: Inhale 2 puffs into the lungs every 6 (six) hours as needed (wheezing/shortness of breath).  03/23/14   Gay Filler Copland,  MD  benzonatate (TESSALON) 100 MG capsule Take 1 capsule (100 mg total) by mouth 3 (three) times daily as needed for cough. 01/16/16   Daryl F de Villier II, PA  BIOTIN PO Take 1 tablet by mouth daily.    Historical Provider, MD  clonazePAM (KLONOPIN) 0.5 MG tablet Take 0.5-1 tablets (0.25-0.5 mg total) by mouth 2 (two) times daily as needed for anxiety. 03/26/14   Shawnee Knapp, MD  Cyanocobalamin (VITAMIN B 12) 100 MCG LOZG Take 100 mcg by mouth daily.    Historical Provider, MD  ergocalciferol (VITAMIN D2) 50000 UNITS capsule Take 1 capsule (50,000 Units total) by mouth once a week. Patient not taking: Reported on 10/25/2014 03/28/14   Shawnee Knapp, MD  lidocaine (LIDODERM) 5 % Place 1 patch onto the skin daily. Remove & Discard patch within 12 hours or as directed by MD 04/28/16   Kinnie Feil, PA-C  methocarbamol (ROBAXIN) 500 MG tablet Take 1 tablet (500 mg total) by mouth 2 (two) times daily. 04/28/16   Kinnie Feil, PA-C  naproxen (NAPROSYN) 500 MG tablet Take 1 tablet (500 mg total) by mouth 2 (two) times daily. 04/28/16   Kinnie Feil, PA-C  predniSONE (DELTASONE) 20 MG tablet Take 3 tablets (60 mg total) by mouth daily. 01/16/16   Daryl Daphene Jaeger II, PA    Family History  Family History  Problem Relation Age of Onset  . Hypertension Father     Social History Social History  Substance Use Topics  . Smoking status: Never Smoker  . Smokeless tobacco: Never Used  . Alcohol use No     Allergies   Amoxicillin; Darvocet [propoxyphene n-acetaminophen]; Latex; Percocet [oxycodone-acetaminophen]; and Promethazine hcl   Review of Systems Review of Systems  Constitutional: Negative for fever and unexpected weight change.  HENT: Negative for congestion and sore throat.   Eyes: Negative for visual disturbance.  Respiratory: Negative for cough and shortness of breath.   Cardiovascular: Negative for chest pain.  Gastrointestinal: Negative for abdominal pain, constipation,  diarrhea, nausea and vomiting.  Genitourinary: Negative for decreased urine volume, difficulty urinating, dysuria, flank pain, pelvic pain, vaginal bleeding and vaginal discharge.  Musculoskeletal: Positive for arthralgias, back pain and gait problem.  Skin: Negative for wound.  Neurological: Negative for dizziness, weakness, light-headedness and headaches.     Physical Exam Updated Vital Signs BP 116/76   Pulse 77   Temp 98 F (36.7 C) (Oral)   Resp 18   Ht 5\' 2"  (1.575 m)   Wt 58.5 kg   LMP 04/23/2016   SpO2 100%   BMI 23.59 kg/m   Physical Exam  Constitutional: She is oriented to person, place, and time. She appears well-developed and well-nourished. No distress.  NAD.  HENT:  Head: Normocephalic and atraumatic.  Right Ear: External ear normal.  Left Ear: External ear normal.  Nose: Nose normal.  Eyes: Conjunctivae and EOM are normal. Pupils are equal, round, and reactive to light. No scleral icterus.  Neck: Normal range of motion. Neck supple. No JVD present.  Cardiovascular: Normal rate, regular rhythm and normal heart sounds.   No murmur heard. Pulmonary/Chest: Effort normal and breath sounds normal. She has no wheezes.  Abdominal: Soft. Bowel sounds are normal. She exhibits no distension and no mass. There is no tenderness. There is no rebound and no guarding.  Musculoskeletal: Normal range of motion. She exhibits tenderness. She exhibits no deformity.  Gait normal.  Full active ROM of CTL spine including flexion, extension, lateral bend and rotation. Patient reports increased LLBP with lumbar spine flexion and extension. Lumbar spine midline tenderness and paraspinal tenderness. Left SI joint and sciatic notch tenderness. No midline right CT spine tenderness. No right CT paraspinal tenderness or increased tone.  Right SI joints and sciatic notch non tender.  Full passive hip, knee and ankle ROM bilaterally.  Negative SLR. Negative Faber. Negative Stinchfield test.    Lymphadenopathy:    She has no cervical adenopathy.  Neurological: She is alert and oriented to person, place, and time.  Gait normal. No foot drop.  5/5 strength with hip flexion and extension, bilaterally.  5/5 strength with knee flexion and extension, bilaterally.  5/5 strength with ankle dorsiflexion and plantar flexion, bilaterally.  Sensation to light touch intact in the distribution of the obturator nerve, lateral cutaneous nerve, femoral nerve, common fibular nerve.  2/4 knee and ankle DTR bilaterally.    Foot: sensation to light touch intact in the distribution of the saphenous nerve, medial plantar nerve, lateral plantar nerve, bilaterally.   Skin: Skin is warm and dry. Capillary refill takes less than 2 seconds.  Psychiatric: She has a normal mood and affect. Her behavior is normal. Judgment and thought content normal.  Nursing note and vitals reviewed.    ED Treatments / Results  Labs (all labs ordered are listed, but only abnormal results are  displayed) Labs Reviewed - No data to display  EKG  EKG Interpretation None       Radiology Ct Lumbar Spine Wo Contrast  Result Date: 04/28/2016 CLINICAL DATA:  Left hip and low back pain. Fall couple weeks ago. Initial encounter. EXAM: CT LUMBAR SPINE WITHOUT CONTRAST TECHNIQUE: Multidetector CT imaging of the lumbar spine was performed without intravenous contrast administration. Multiplanar CT image reconstructions were also generated. COMPARISON:  None. FINDINGS: Segmentation: 5 lumbar type vertebrae. Alignment: Normal. Vertebrae: No acute fracture or focal pathologic process. Paraspinal and other soft tissues: Trace pelvic fluid which may be physiologic. Disc levels: T12- L1: Minor facet spurring.  No evidence of impingement L1-L2: Mild facet spurring.  No evidence of impingement L2-L3: Mild facet spurring.  No evidence of impingement L3-L4: Mild facet spurring.  No evidence of impingement L4-L5: Advanced facet arthropathy  with greater spurring and vacuum phenomenon on the right. Gas in the right canal and foraminal entrance demonstrates a synovial cyst. Mild disc narrowing and bulging. Inferior foraminal narrowing without noted compression. L5-S1:Mild facet spurring.  No impingement. IMPRESSION: 1. No acute or posttraumatic finding. 2. Generalized lumbar facet arthropathy, most notable at L4-5 where there is advanced degeneration on the right and a 9 mm synovial cyst narrowing the subarticular recess. 3. No noted left-sided impingement to explain left leg symptoms. Electronically Signed   By: Monte Fantasia M.D.   On: 04/28/2016 09:41   Ct Hip Left Wo Contrast  Result Date: 04/28/2016 CLINICAL DATA:  Persistent low back and left hip pain after falling several weeks ago. EXAM: CT OF THE LEFT HIP WITHOUT CONTRAST TECHNIQUE: Multidetector CT imaging of the left hip was performed according to the standard protocol. Multiplanar CT image reconstructions were also generated. COMPARISON:  None. FINDINGS: Bones/Joint/Cartilage Examination is limited to the left hip and lower left hemipelvis. The entire pelvis is not imaged. There is no evidence of acute fracture or dislocation. There is no evidence of femoral head avascular necrosis. The left hip joint spaces are maintained. There is no significant joint effusion. There is a focal ossific density lateral to the acetabulum which is likely an accessory ossicle. Ligaments Not relevant for exam/indication. Muscles and Tendons The visualize left hip muscles and tendons appear unremarkable as evaluated by CT. Soft tissues No periarticular hematoma or other fluid collection seen. The visualized internal pelvic contents appear unremarkable. IMPRESSION: No acute findings or explanation for the patient's symptoms. Electronically Signed   By: Richardean Sale M.D.   On: 04/28/2016 10:10    Procedures Procedures (including critical care time)  Medications Ordered in ED Medications  ibuprofen  (ADVIL,MOTRIN) tablet 400 mg (400 mg Oral Given 04/28/16 0726)     Initial Impression / Assessment and Plan / ED Course  I have reviewed the triage vital signs and the nursing notes.  Pertinent labs & imaging results that were available during my care of the patient were reviewed by me and considered in my medical decision making (see chart for details).  Clinical Course as of Apr 28 2009  Sun Apr 28, 2016  2003 IMPRESSION: 1. No acute or posttraumatic finding. 2. Generalized lumbar facet arthropathy, most notable at L4-5 where there is advanced degeneration on the right and a 9 mm synovial cyst narrowing the subarticular recess. 3. No noted left-sided impingement to explain left leg symptoms. CT Lumbar Spine Wo Contrast [CG]  2007 IMPRESSION: No acute findings or explanation for the patient's symptoms. CT Hip Left Wo Contrast [CG]    Clinical  Course User Index [CG] Kinnie Feil, PA-C   Given physical exam findings of midline lumbar spine tenderness, left SI joint and sciatic notch tenderness I suspected possible compression fracture.  No lower leg neurovascular deficits. CT scan ordered to r/o disc bulging and nerve impression.    CT scan showed no acute posttraumatic findings to explain left sided leg symptoms.  There was generalized lumbar facet arthropathy most notable at L4-L5 and an incidental 82mm synovial cyst.  No noted left sided impingement to explain left leg symptoms.  CT scan discussed with patient.  Patient has been ambulatory for the past month and in ED. I think she is safe for discharge at this time with analgesics including naproxen, robaxin and lidocaine patch.  Patient did not want narcotic pain medications, and I agree I do not think she necessarily needs narcotic strength meds given that this occurred 1 month ago and she has been ambulatory.  Patient encouraged to establish care with [facility] for further pain management and possible PT. Patient verbalized  understanding and states she will follow up there.    Final Clinical Impressions(s) / ED Diagnoses   Final diagnoses:  Fall, initial encounter  Left hip pain    New Prescriptions Discharge Medication List as of 04/28/2016 10:40 AM    START taking these medications   Details  lidocaine (LIDODERM) 5 % Place 1 patch onto the skin daily. Remove & Discard patch within 12 hours or as directed by MD, Starting Sun 04/28/2016, Print    methocarbamol (ROBAXIN) 500 MG tablet Take 1 tablet (500 mg total) by mouth 2 (two) times daily., Starting Sun 04/28/2016, Print    naproxen (NAPROSYN) 500 MG tablet Take 1 tablet (500 mg total) by mouth 2 (two) times daily., Starting Sun 04/28/2016, Print         Kinnie Feil, PA-C 04/28/16 2011    Orlie Dakin, MD 04/29/16 1600

## 2016-04-28 NOTE — ED Notes (Signed)
Declined W/C at D/C and was escorted to lobby by RN. 

## 2016-09-02 ENCOUNTER — Ambulatory Visit (HOSPITAL_COMMUNITY)
Admission: EM | Admit: 2016-09-02 | Discharge: 2016-09-02 | Disposition: A | Discharge status: Home / Self Care | Payer: Self-pay | Attending: Family Medicine | Admitting: Family Medicine

## 2016-09-02 ENCOUNTER — Encounter (HOSPITAL_COMMUNITY): Payer: Self-pay | Admitting: Emergency Medicine

## 2016-09-02 DIAGNOSIS — R21 Rash and other nonspecific skin eruption: Secondary | ICD-10-CM

## 2016-09-02 MED ORDER — DOXYCYCLINE HYCLATE 100 MG PO TABS
100.0000 mg | ORAL_TABLET | Freq: Two times a day (BID) | ORAL | 0 refills | Status: DC
Start: 2016-09-02 — End: 2017-02-12

## 2016-09-02 MED ORDER — PRENATAL VITAMINS 28-0.8 MG PO TABS
1.0000 | ORAL_TABLET | ORAL | 2 refills | Status: AC
Start: 2016-09-02 — End: 2016-09-03

## 2016-09-02 MED ORDER — TOPIRAMATE 50 MG PO TABS: 50.0000 mg | ORAL_TABLET | Freq: Every day | ORAL | 3 refills | Status: DC

## 2016-09-02 MED ORDER — CLONAZEPAM 0.5 MG PO TABS: 0.5000 mg | ORAL_TABLET | Freq: Every day | ORAL | 3 refills | Status: DC | PRN

## 2016-09-02 MED FILL — clonazePAM 0.5 MG TABS: 0.5 | 30 days supply | Qty: 30 | Fill #0

## 2016-09-02 MED FILL — TOPIRAMATE 50 MG TABLET: 50 | 30 days supply | Qty: 30 | Fill #0

## 2016-09-02 MED FILL — DOXYCYCLINE HYCLATE 100 MG: 100 | 7 days supply | Qty: 14 | Fill #0

## 2016-09-02 NOTE — ED Provider Notes (Signed)
[facility]    CSN: [id number] Arrival date & time: 09/02/16  1425     History   Chief Complaint Chief Complaint  Patient presents with  . Rash    HPI Colleen Mccullough is a 46 y.o. female.   The patient presented to the [facility] with a complaint of a rash on her forehead. This began a couple days ago, and seems to be following a four-day illness nausea and malaise. Her daughter has severe pharyngitis and is taking antibiotics for it.  Patient's periods up and regular.  Patient works in the spot.  Patient has multiple other problems including anxiety, migraines, and no insurance.      Past Medical History:  Diagnosis Date  . ADD (attention deficit disorder)   . Asthma   . Bipolar 1 disorder ([place])   . Cancer ([place])   . Depression   . Hyperglycemia   . Hypoglycemia   . Migraines     Patient Active Problem List   Diagnosis Date Noted  . Vitamin D deficiency 03/28/2014  . Bipolar 1 disorder, mixed, moderate ([place]) 03/23/2014  . Asthma 03/23/2014    Past Surgical History:  Procedure Laterality Date  . CESAREAN SECTION    . TUBAL LIGATION      OB History    No data available       Home Medications    Prior to Admission medications   Not on File    Family History Family History  Problem Relation Age of Onset  . Hypertension Father     Social History Social History  Substance Use Topics  . Smoking status: Never Smoker  . Smokeless tobacco: Never Used  . Alcohol use No     Allergies   Amoxicillin; Darvocet [propoxyphene n-acetaminophen]; Latex; Percocet [oxycodone-acetaminophen]; and Promethazine hcl   Review of Systems Review of Systems  Constitutional: Positive for fatigue.  Gastrointestinal: Positive for nausea. Negative for diarrhea and vomiting.  Skin: Positive for rash.  Neurological: Positive for headaches.     Physical Exam Triage Vital Signs ED Triage Vitals  Enc Vitals Group     BP 09/02/16 1449 91/72   Pulse Rate 09/02/16 1449 95     Resp 09/02/16 1449 18     Temp 09/02/16 1449 98.4 F (36.9 C)     Temp Source 09/02/16 1449 Oral     SpO2 09/02/16 1449 100 %     Weight --      Height --      Head Circumference --      Peak Flow --      Pain Score 09/02/16 1448 0     Pain Loc --      Pain Edu? --      Excl. in [place]? --    No data found.   Updated Vital Signs BP 91/72 (BP Location: Right Arm)   Pulse 95   Temp 98.4 F (36.9 C) (Oral)   Resp 18   SpO2 100%    Physical Exam  Constitutional: She is oriented to person, place, and time. She appears well-developed and well-nourished.  HENT:  Right Ear: External ear normal.  Left Ear: External ear normal.  Mouth/Throat: Oropharynx is clear and moist.  Eyes: Conjunctivae are normal.  Neck: Normal range of motion. Neck supple.  Pulmonary/Chest: Effort normal.  Musculoskeletal: Normal range of motion.  Neurological: She is alert and oriented to person, place, and time.  Skin:  Acneiform rash on forehead in malar regions  Nursing  note and vitals reviewed.    UC Treatments / Results  Labs (all labs ordered are listed, but only abnormal results are displayed) Labs Reviewed - No data to display  EKG  EKG Interpretation None       Radiology No results found.  Procedures Procedures (including critical care time)  Medications Ordered in UC Medications - No data to display   Initial Impression / Assessment and Plan / UC Course  I have reviewed the triage vital signs and the nursing notes.  Pertinent labs & imaging results that were available during my care of the patient were reviewed by me and considered in my medical decision making (see chart for details).     Final Clinical Impressions(s) / UC Diagnoses   Final diagnoses:  None    New Prescriptions New Prescriptions   No medications on file     Robyn Haber, MD 09/02/16 1546

## 2016-09-02 NOTE — ED Triage Notes (Signed)
The patient presented to the [facility] with a complaint of a rash on her forehead.

## 2016-09-02 NOTE — Discharge Instructions (Signed)
I suspect rash was set off by a recent viral illness. This way you're feeling bad in general. Nevertheless I think the antibiotic I prescribed will help you get better faster.  I prescribed medicine for intermittent anxiety, migraines, and your history of vitamin D deficiency. I expect that it will take a few more days before you feel 100 percent.

## 2016-09-05 ENCOUNTER — Ambulatory Visit: Payer: Self-pay | Admitting: Family Medicine

## 2016-10-08 ENCOUNTER — Ambulatory Visit (HOSPITAL_COMMUNITY)
Admission: EM | Admit: 2016-10-08 | Discharge: 2016-10-08 | Disposition: A | Discharge status: Home / Self Care | Payer: Self-pay | Attending: Emergency Medicine | Admitting: Emergency Medicine

## 2016-10-08 ENCOUNTER — Encounter (HOSPITAL_COMMUNITY): Payer: Self-pay | Admitting: Emergency Medicine

## 2016-10-08 DIAGNOSIS — M25531 Pain in right wrist: Secondary | ICD-10-CM

## 2016-10-08 DIAGNOSIS — M25532 Pain in left wrist: Secondary | ICD-10-CM

## 2016-10-08 MED ORDER — NAPROXEN 500 MG PO TABS: 500.0000 mg | ORAL_TABLET | Freq: Two times a day (BID) | ORAL | 0 refills | Status: AC

## 2016-10-08 NOTE — Discharge Instructions (Signed)
Your wrist pain is most likely due to inflammation. Take naproxen 500mg  twice a day for 10 days. Ice compress for inflammation. Use wrist brace during activity. Follow up with PCP for further workup and referrals needed.

## 2016-10-08 NOTE — ED Triage Notes (Signed)
Pt reports pain in her forearms, bilaterally.  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

## 2016-10-08 NOTE — ED Provider Notes (Signed)
CSN: [id number]     Arrival date & time 10/08/16  1008 History   None    Chief Complaint  Patient presents with  . Arm Pain    bilateral   (Consider location/radiation/quality/duration/timing/severity/associated sxs/prior Treatment) 46 year old female comes in with bilateral wrist pain for the last 2-3 days. She states she has had this in the past, but has always resolved on its own. Pain was worse last night, causing trouble sleeping. Denies injury, numbness and tingling, joint swelling. Her work requires repetitive motions, and lots of hands-on activity. She has not taken anything for the pain. States twisting motion exacerbates the pain the most.      Past Medical History:  Diagnosis Date  . ADD (attention deficit disorder)   . Asthma   . Bipolar 1 disorder ([place])   . Cancer ([place])   . Depression   . Hyperglycemia   . Hypoglycemia   . Migraines    Past Surgical History:  Procedure Laterality Date  . CESAREAN SECTION    . TUBAL LIGATION     Family History  Problem Relation Age of Onset  . Hypertension Father    Social History  Substance Use Topics  . Smoking status: Never Smoker  . Smokeless tobacco: Never Used  . Alcohol use No   OB History    No data available     Review of Systems  Reason unable to perform ROS: See HPI as above.    Allergies  Amoxicillin; Darvocet [propoxyphene n-acetaminophen]; Latex; Percocet [oxycodone-acetaminophen]; and Promethazine hcl  Home Medications   Prior to Admission medications   Medication Sig Start Date End Date Taking? Authorizing Provider  clonazePAM (KLONOPIN) 0.5 MG tablet Take 1 tablet (0.5 mg total) by mouth daily as needed for anxiety. 09/02/16  Yes Robyn Haber, MD  topiramate (TOPAMAX) 50 MG tablet Take 1 tablet (50 mg total) by mouth daily. 09/02/16  Yes Robyn Haber, MD  doxycycline (VIBRA-TABS) 100 MG tablet Take 1 tablet (100 mg total) by mouth 2 (two) times daily. 09/02/16   Robyn Haber, MD   naproxen (NAPROSYN) 500 MG tablet Take 1 tablet (500 mg total) by mouth 2 (two) times daily. 10/08/16 10/18/16  Ok Edwards, PA-C   Meds Ordered and Administered this Visit  Medications - No data to display  BP 100/69 (BP Location: Left Arm)   Pulse 90   Temp 98.7 F (37.1 C) (Oral)   LMP 09/12/2016 (Approximate)   SpO2 100%  No data found.   Physical Exam  Constitutional: She is oriented to person, place, and time. She appears well-developed and well-nourished. No distress.  HENT:  Head: Normocephalic and atraumatic.  Eyes: Pupils are equal, round, and reactive to light. Conjunctivae are normal.  Musculoskeletal:  No specific pain on palpation of the radial or ulnar aspect of the wrist. Pain was elicited by range of motion. Patient to be distracted, pain is not reproducible each time. Full range of motion. Strength normal and equal bilaterally. Sensation intact and equal bilaterally. Radial pulse 2+ and equal. Capillary refill less than 2 seconds.  Negative Tinel's, negative Finkelstein's.  Neurological: She is alert and oriented to person, place, and time.  Skin: Skin is warm and dry.  Psychiatric: She has a normal mood and affect. Her behavior is normal. Judgment normal.    Urgent Care Course     Procedures (including critical care time)  Labs Review Labs Reviewed - No data to display  Imaging Review No results found.  MDM   1. Bilateral wrist pain    Discussed with patient history and exam most consistent with inflammation that is causing her wrist pain. Start naproxen 500 mg twice a day for 10 days. Ice compress for inflammation. Patient to wear a wrist brace during activity. Given repetitive nature of her job, patient to follow-up with PCP for referral as needed if symptoms do not resolve. Resources given to patient.   Ok Edwards, PA-C 10/08/16 [phone]

## 2016-10-23 MED FILL — TOPIRAMATE 50 MG TABLET: 50 | 30 days supply | Qty: 30 | Fill #1

## 2017-01-27 ENCOUNTER — Encounter: Payer: Self-pay | Admitting: Family Medicine

## 2017-01-27 ENCOUNTER — Ambulatory Visit (INDEPENDENT_AMBULATORY_CARE_PROVIDER_SITE_OTHER): Payer: BLUE CROSS/BLUE SHIELD | Admitting: Family Medicine

## 2017-01-27 VITALS — BP 120/82 | HR 117 | Temp 98.7°F | Resp 17 | Ht 62.0 in | Wt 129.6 lb

## 2017-01-27 DIAGNOSIS — Z23 Encounter for immunization: Secondary | ICD-10-CM | POA: Diagnosis not present

## 2017-01-27 DIAGNOSIS — R079 Chest pain, unspecified: Secondary | ICD-10-CM

## 2017-01-27 DIAGNOSIS — R5382 Chronic fatigue, unspecified: Secondary | ICD-10-CM

## 2017-01-27 NOTE — Patient Instructions (Addendum)
IF you received an x-ray today, you will receive an invoice from [facility]. Please contact [facility] at [phone] with questions or concerns regarding your invoice.   IF you received labwork today, you will receive an invoice from [facility]. Please contact [facility] at [phone] with questions or concerns regarding your invoice.   Our billing staff will not be able to assist you with questions regarding bills from these companies.  You will be contacted with the lab results as soon as they are available. The fastest way to get your results is to activate your My Chart account. Instructions are located on the last page of this paperwork. If you have not heard from [place] regarding the results in 2 weeks, please contact this office.    We recommend that you schedule a mammogram for breast cancer screening. Typically, you do not need a referral to do this. Please contact a local imaging center to schedule your mammogram.  [facility] - [phone]  *ask for the Radiology [place] ([place]) - [phone] or [phone]  [facility] - [phone] [place] [phone] [facility]  - [phone]  *ask for the [facility] - [phone]  *ask for the Radiology Department [facility] - [phone]  *ask for the [place] - [phone] Fatigue Fatigue is feeling tired all of the time, a lack of energy, or a lack of motivation. Occasional or mild fatigue is often a normal response to activity or life in general. However, long-lasting (chronic) or extreme fatigue may indicate an underlying medical condition. Follow these instructions at home: Watch your fatigue for any changes. The following actions may help to lessen any discomfort you are feeling:  Talk to your health care provider about how much  sleep you need each night. Try to get the required amount every night.  Take medicines only as directed by your health care provider.  Eat a healthy and nutritious diet. Ask your health care provider if you need help changing your diet.  Drink enough fluid to keep your urine clear or pale yellow.  Practice ways of relaxing, such as yoga, meditation, massage therapy, or acupuncture.  Exercise regularly.  Change situations that cause you stress. Try to keep your work and personal routine reasonable.  Do not abuse illegal drugs.  Limit alcohol intake to no more than 1 drink per day for nonpregnant women and 2 drinks per day for men. One drink equals 12 ounces of beer, 5 ounces of wine, or 1 ounces of hard liquor.  Take a multivitamin, if directed by your health care provider.  Contact a health care provider if:  Your fatigue does not get better.  You have a fever.  You have unintentional weight loss or gain.  You have headaches.  You have difficulty: ? Falling asleep. ? Sleeping throughout the night.  You feel angry, guilty, anxious, or sad.  You are unable to have a bowel movement (constipation).  You skin is dry.  Your legs or another part of your body is swollen. Get help right away if:  You feel confused.  Your vision is blurry.  You feel faint or pass out.  You have a severe headache.  You have severe abdominal, pelvic, or back pain.  You have chest pain, shortness of breath, or an irregular or fast heartbeat.  You are unable to urinate or you urinate  less than normal.  You develop abnormal bleeding, such as bleeding from the rectum, vagina, nose, lungs, or nipples.  You vomit blood.  You have thoughts about harming yourself or committing suicide.  You are worried that you might harm someone else. This information is not intended to replace advice given to you by your health care provider. Make sure you discuss any questions you have with your health  care provider. Document Released: 12/23/2006 Document Revised: 08/03/2015 Document Reviewed: 06/29/2013 Elsevier Interactive Patient Education  [employer].

## 2017-01-27 NOTE — Progress Notes (Signed)
Chief Complaint  Patient presents with  . ADHD  . Chest Pain    heart pain, cp with anxiety doesn't klonipin every time she has anxiety, Heart pain intermittent and last for couple minutes, noticed x  a long time but no insurance  . fatigue and wants bloodwork    HPI   Pt reports that she has been having intermittent chest pains that seems to be substernal, epigastric and RUQ pain She has her gallbladder  She reports that she has to curl up in the fetal position on the right side  (right lateral decubitus) to get relief She reports that her pain is 8-10/10 every single time She reports that it happens at rest and seems to be relieved if she applies pressure to the chest She states that she often cannot sit up due to the severity of the pain when it occurs The pain last minutes  The pain tends to stay in the RUQ or epigastrium It is non-radiating She denies diaphoresis She gets nauseous when it occurs  She denies previous history of trauma to the chest 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 COPD Mother   . Hypertension Father   . Heart failure Maternal Grandmother    She is a nonsmoker    Past Medical History:  Diagnosis Date  . ADD (attention deficit disorder)   . Asthma   . Atypical chest pain 02/17/2017  . Bipolar 1 disorder ([place])   . Cancer ([place])   . Depression   . Hyperglycemia   . Hypoglycemia   . Migraines   . Palpitations 02/17/2017    Current Outpatient Medications  Medication Sig Dispense Refill  . topiramate (TOPAMAX) 50 MG tablet Take 1 tablet (50 mg total) by mouth daily. 30 tablet 3  . clonazePAM (KLONOPIN) 1 MG tablet Take 1 tablet (1 mg total) by mouth at bedtime as needed for anxiety. 30 tablet 0  . predniSONE (DELTASONE) 20 MG tablet Take 3 tabs qd x 3d, then 2 tabs qd x 3d then 1 tab qd x 3d. 18 tablet 0   No  current facility-administered medications for this visit.     Allergies:  Allergies  Allergen Reactions  . Amoxicillin Other (See Comments)    White splotches Has patient had a PCN reaction causing immediate rash, facial/tongue/throat swelling, SOB or lightheadedness with hypotension: unknown Has patient had a PCN reaction causing severe rash involving mucus membranes or skin necrosis: unknown Has patient had a PCN reaction that required hospitalization : drs office Did PCN reaction occurring within the last 10 years: No, childhood allergy If all of the above answers are "NO", then may proceed with Cephalosporin use.   Carlton Adam [Propoxyphene N-Acetaminophen] Nausea And Vomiting  . Latex Other (See Comments)    Irritates on the inside  . Percocet [Oxycodone-Acetaminophen] Nausea And Vomiting  . Promethazine Hcl Other (See Comments)    seizures    Past Surgical History:  Procedure Laterality Date  . CESAREAN SECTION    . TUBAL LIGATION      Social History   Socioeconomic History  . Marital status: Single    Spouse name: Not on file  . Number of children: Not on file  . Years of education: Not on file  . Highest education level: Not on file  Occupational History  . Not on file  Social Needs  . Financial resource strain: Not on file  . Food insecurity:    Worry: Not on file    Inability: Not on file  . Transportation needs:    Medical: Not on file    Non-medical: Not on file  Tobacco Use  . Smoking status: Never Smoker  . Smokeless tobacco: Never Used  Substance and Sexual Activity  . Alcohol use: No  . Drug use: No  . Sexual activity: Never  Lifestyle  . Physical activity:    Days per week: Not on file    Minutes per session: Not on file  . Stress: Not on file  Relationships  . Social connections:    Talks on phone: Not on file    Gets together: Not on file    Attends religious service: Not on file    Active member of club or organization: Not on file     Attends meetings of clubs or organizations: Not on file    Relationship status: Not on file  Other Topics Concern  . Not on file  Social History Narrative  . Not on file    Family History  Problem Relation Age of Onset  . COPD Mother   . Hypertension Father   . Heart failure Maternal Grandmother      ROS Review of Systems See HPI Constitution: No fevers or chills No malaise No diaphoresis Skin: No rash or itching Eyes: no blurry vision, no double vision GU: no dysuria or hematuria Neuro: no dizziness or headaches all others reviewed and negative   Objective: Vitals:   01/27/17 1220  BP: 120/82  Pulse: (!) 117  Resp: 17  Temp: 98.7 F (37.1 C)  TempSrc: Oral  SpO2: 100%  Weight: 129 lb 9.6 oz (58.8 kg)  Height: 5\' 2"  (1.575 m)    Physical Exam  Constitutional: She is oriented to person, place, and time. She appears well-developed and well-nourished.  HENT:  Head: Normocephalic and atraumatic.  Eyes: Pupils are equal, round, and reactive to light.  Neck: Normal range of motion.  Cardiovascular: Normal rate, regular rhythm, intact distal pulses and normal pulses.  No murmur heard. Pulses:      Carotid pulses are 2+ on the right side, and 2+ on the left side. Pulmonary/Chest: Effort normal. No accessory muscle usage or stridor. No tachypnea. No respiratory distress.  Abdominal: Soft. Normal appearance and bowel sounds are normal. She exhibits no distension, no ascites and no mass. There is no splenomegaly or hepatomegaly. There is no tenderness. There is no rebound, no guarding, no tenderness at McBurney's point and negative Murphy's sign.  Musculoskeletal: Normal range of motion.       Right lower leg: Normal.       Left lower leg: Normal.  Neurological: She is alert and oriented to person, place, and time.  Skin: Skin is warm. Capillary refill takes less than 2 seconds.  Psychiatric: She has a normal mood and affect. Her behavior is normal.   EKG- NSR, no st  elevation  Assessment and Plan Hollye was seen today for adhd, chest pain and fatigue and wants bloodwork.  Diagnoses and all orders for this visit:  Chest pain, unspecified type- normal ecg Referral placed for cardiology for stress testing and additional work up With screen for CAD risk factors -     EKG 12-Lead -     Lipid panel -     Lipase -     Ambulatory referral to Cardiology  Need  for prophylactic vaccination and inoculation against influenza -     Flu Vaccine QUAD 36+ mos IM  Chronic fatigue -  Will screen for modifiable risk factors -     Comprehensive metabolic panel -     TSH -     CBC  Need for Tdap vaccination -     Tdap vaccine greater than or equal to 7yo IM   A total of 25 minutes were spent face-to-face with the patient during this encounter and over half of that time was spent on counseling and coordination of care.   [place]

## 2017-01-28 LAB — COMPREHENSIVE METABOLIC PANEL
ALT: 18 IU/L (ref 0–32)
AST: 25 IU/L (ref 0–40)
Albumin/Globulin Ratio: 2 (ref 1.2–2.2)
Albumin: 4.3 g/dL (ref 3.5–5.5)
Alkaline Phosphatase: 87 IU/L (ref 39–117)
BUN/Creatinine Ratio: 18 (ref 9–23)
BUN: 11 mg/dL (ref 6–24)
Bilirubin Total: 0.3 mg/dL (ref 0.0–1.2)
CO2: 25 mmol/L (ref 20–29)
Calcium: 9.1 mg/dL (ref 8.7–10.2)
Chloride: 105 mmol/L (ref 96–106)
Creatinine, Ser: 0.6 mg/dL (ref 0.57–1.00)
GFR calc Af Amer: 126 mL/min/{1.73_m2} (ref >59)
GFR calc non Af Amer: 109 mL/min/{1.73_m2} (ref >59)
Globulin, Total: 2.2 g/dL (ref 1.5–4.5)
Glucose: 84 mg/dL (ref 65–99)
Potassium: 4.3 mmol/L (ref 3.5–5.2)
Sodium: 141 mmol/L (ref 134–144)
Total Protein: 6.5 g/dL (ref 6.0–8.5)

## 2017-01-28 LAB — CBC
Hematocrit: 37.5 % (ref 34.0–46.6)
Hemoglobin: 12.4 g/dL (ref 11.1–15.9)
MCH: 29 pg (ref 26.6–33.0)
MCHC: 33.1 g/dL (ref 31.5–35.7)
MCV: 88 fL (ref 79–97)
Platelets: 217 10*3/uL (ref 150–379)
RBC: 4.28 x10E6/uL (ref 3.77–5.28)
RDW: 13.2 % (ref 12.3–15.4)
WBC: 5.2 10*3/uL (ref 3.4–10.8)

## 2017-01-28 LAB — LIPID PANEL
Chol/HDL Ratio: 2.7 ratio (ref 0.0–4.4)
Cholesterol, Total: 150 mg/dL (ref 100–199)
HDL: 56 mg/dL (ref >39)
LDL Calculated: 82 mg/dL (ref 0–99)
Triglycerides: 60 mg/dL (ref 0–149)
VLDL Cholesterol Cal: 12 mg/dL (ref 5–40)

## 2017-01-28 LAB — TSH: TSH: 1.3 u[IU]/mL (ref 0.450–4.500)

## 2017-01-28 LAB — LIPASE: Lipase: 19 U/L (ref 14–72)

## 2017-01-29 ENCOUNTER — Ambulatory Visit: Payer: Self-pay | Admitting: Family Medicine

## 2017-01-31 ENCOUNTER — Telehealth: Payer: Self-pay | Admitting: Family Medicine

## 2017-01-31 NOTE — Telephone Encounter (Signed)
Copied from [place]. Topic: Quick Communication - Lab Results >> Jan 31, 2017  2:59 PM [name], NT wrote: Patient calling again for lab results and wants to know if her vitamin d is low can she get a prescription written for it , please call [phone], mostly concerned about vitamin d

## 2017-01-31 NOTE — Telephone Encounter (Signed)
Pt is requesting lab results.  Advised that they have not been reviewed yet.  Pt would just like to be notified of results - either Mychart, phone call, etc.  Thank you!

## 2017-01-31 NOTE — Telephone Encounter (Signed)
Duplicate encounter, closing note.  

## 2017-01-31 NOTE — Telephone Encounter (Signed)
Patient is calling for her lab results- she would like a copy mailed to her if possible. Told patient if results are normal- she probably would not get a call.

## 2017-01-31 NOTE — Telephone Encounter (Signed)
Copied from [place]. Topic: Quick Communication - Lab Results >> Jan 31, 2017  2:59 PM [name], NT wrote: Patient calling again for lab results and wants to know if her vitamin d is low can she get a prescription written for it , please call [phone], mostly concerned about vitamin d

## 2017-02-03 NOTE — Telephone Encounter (Signed)
Pt called to go over her lab results. Also recommendations to increase her energy until she sees Dr. Brigitte Pulse. Pt voiced understanding.

## 2017-02-12 ENCOUNTER — Ambulatory Visit (INDEPENDENT_AMBULATORY_CARE_PROVIDER_SITE_OTHER): Payer: BLUE CROSS/BLUE SHIELD | Admitting: Cardiovascular Disease

## 2017-02-12 ENCOUNTER — Encounter: Payer: Self-pay | Admitting: Cardiovascular Disease

## 2017-02-12 VITALS — BP 111/71 | HR 94 | Ht 62.0 in | Wt 130.0 lb

## 2017-02-12 DIAGNOSIS — R079 Chest pain, unspecified: Secondary | ICD-10-CM | POA: Diagnosis not present

## 2017-02-12 DIAGNOSIS — R002 Palpitations: Secondary | ICD-10-CM

## 2017-02-12 DIAGNOSIS — R0789 Other chest pain: Secondary | ICD-10-CM

## 2017-02-12 NOTE — Patient Instructions (Addendum)
Medication Instructions:  Your physician recommends that you continue on your current medications as directed. Please refer to the Current Medication list given to you today.  Labwork: NONE  Testing/Procedures: Your physician has requested that you have an exercise tolerance test. For further information please visit [email or web address]. Please also follow instruction sheet, as given.  Follow-Up: Your physician recommends that you schedule a follow-up appointment in: 2 MONTH OV  If you need a refill on your cardiac medications before your next appointment, please call your pharmacy.   Exercise Stress Electrocardiogram An exercise stress electrocardiogram is a test to check how blood flows to your heart. It is done to find areas of poor blood flow. You will need to walk on a treadmill for this test. The electrocardiogram will record your heartbeat when you are at rest and when you are exercising. What happens before the procedure?  Do not have drinks with caffeine or foods with caffeine for 24 hours before the test, or as told by your doctor. This includes coffee, tea (even decaf tea), sodas, chocolate, and cocoa.  Follow your doctor's instructions about eating and drinking before the test.  Ask your doctor what medicines you should or should not take before the test. Take your medicines with water unless told by your doctor not to.  If you use an inhaler, bring it with you to the test.  Bring a snack to eat after the test.  Do not  smoke for 4 hours before the test.  Do not put lotions, powders, creams, or oils on your chest before the test.  Wear comfortable shoes and clothing. What happens during the procedure?  You will have patches put on your chest. Small areas of your chest may need to be shaved. Wires will be connected to the patches.  Your heart rate will be watched while you are resting and while you are exercising.  You will walk on the treadmill. The treadmill will  slowly get faster to raise your heart rate.  The test will take about 1-2 hours. What happens after the procedure?  Your heart rate and blood pressure will be watched after the test.  You may return to your normal diet, activities, and medicines or as told by your doctor. This information is not intended to replace advice given to you by your health care provider. Make sure you discuss any questions you have with your health care provider. Document Released: 08/14/2007 Document Revised: 10/25/2015 Document Reviewed: 11/02/2012 Elsevier Interactive Patient Education  [employer].
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
## 2017-02-13 ENCOUNTER — Ambulatory Visit (INDEPENDENT_AMBULATORY_CARE_PROVIDER_SITE_OTHER): Payer: BLUE CROSS/BLUE SHIELD

## 2017-02-13 ENCOUNTER — Ambulatory Visit: Payer: BLUE CROSS/BLUE SHIELD | Admitting: Family Medicine

## 2017-02-13 DIAGNOSIS — E611 Iron deficiency: Secondary | ICD-10-CM | POA: Diagnosis not present

## 2017-02-13 DIAGNOSIS — F411 Generalized anxiety disorder: Secondary | ICD-10-CM | POA: Diagnosis not present

## 2017-02-13 DIAGNOSIS — F988 Other specified behavioral and emotional disorders with onset usually occurring in childhood and adolescence: Secondary | ICD-10-CM

## 2017-02-13 DIAGNOSIS — E538 Deficiency of other specified B group vitamins: Secondary | ICD-10-CM | POA: Diagnosis not present

## 2017-02-13 DIAGNOSIS — M25531 Pain in right wrist: Secondary | ICD-10-CM | POA: Diagnosis not present

## 2017-02-13 DIAGNOSIS — G5601 Carpal tunnel syndrome, right upper limb: Secondary | ICD-10-CM | POA: Diagnosis not present

## 2017-02-13 DIAGNOSIS — F3162 Bipolar disorder, current episode mixed, moderate: Secondary | ICD-10-CM

## 2017-02-13 DIAGNOSIS — E559 Vitamin D deficiency, unspecified: Secondary | ICD-10-CM

## 2017-02-13 DIAGNOSIS — M25561 Pain in right knee: Secondary | ICD-10-CM | POA: Diagnosis not present

## 2017-02-13 MED ORDER — PREDNISONE 20 MG PO TABS: ORAL_TABLET | ORAL | 0 refills | Status: DC

## 2017-02-13 MED ORDER — CLONAZEPAM 1 MG PO TABS: 1.0000 mg | ORAL_TABLET | Freq: Every evening | ORAL | 0 refills | Status: DC | PRN

## 2017-02-13 NOTE — Progress Notes (Signed)
Subjective:    Patient ID: Colleen Mccullough, female    DOB: 25-Oct-1970, 46 y.o.   MRN: [id number]   Chief Complaint  Patient presents with  . Knee Pain    off/on, left hip shooting pain going on for months, carpal tunnel in right wrist getting worse for last 3 months, fatigue wants labs done     HPI   Colleen Mccullough is a 46 yo woman who I last saw 3 years prior. This is only the 3rd time I have seen this patient since our first visit 09/2012. She was then seen mult times at [facility] UC and ER since then until 3 wks ago, she was seen by one of my partners for chest pain. Had nml cbc, cmp, lipase, lipids and was seen by cardiology yesterday. At each of her prior visits, she has numerous complaints of pain and mental health illnesses. She has a PMHx of asthma, recurrent bronchitis, hemorrhoids, anemia, anxiety, chronic fatigue, bipolar 1, depression, ADD, and migraines.  Seen in ED for  B wrist pain 4 mos ago.   States her anxiety, ADD, and carpal tunnel are bothering her the most.  CTS worst last 3 months as she is working in house keeping/spa attendent so folding, reaching, lifting - very ankile.  She has started wearing a brace some but it causes more pain with moving it at all from the pressure of the metal - can only wear for 2-3 hrs before she can't tolerate the brace at all. Not wearing brace o/n but has tried previously.  She can feel the heat from it sometimes.  She has tried naproxen 500mg  which didn't help at all. She will get to wear she can't move at all.  When she is driving, her knee becomes so full of fluid and red and she looses almost all ROM where she can't even hardly move it - hapeens when she drives more than 25-85 min. The only thing that helps is when she puts [place] on which helps in 10-20 min.  ADD - can't focus, has been off of medication for a long time. Used to be on vyvanse.   Feels like she is on the verge of a nervous breakdown - tachycardia, panicy all the time,  anxiety attacks, feels like she is going through the room all the time.  States the prior rx for klonopin did help as it and it lasted her for 6 months as she just takes prn for anxiety attacks.  Talked about this with cardiologist apparently who rec daily anxiety medicine.  Past Medical History:  Diagnosis Date  . ADD (attention deficit disorder)   . Asthma   . Bipolar 1 disorder ([place])   . Cancer ([place])   . Depression   . Hyperglycemia   . Hypoglycemia   . Migraines    Past Surgical History:  Procedure Laterality Date  . CESAREAN SECTION    . TUBAL LIGATION     Current Outpatient Medications on File Prior to Visit  Medication Sig Dispense Refill  . topiramate (TOPAMAX) 50 MG tablet Take 1 tablet (50 mg total) by mouth daily. 30 tablet 3   No current facility-administered medications on file prior to visit.    Allergies  Allergen Reactions  . Amoxicillin Other (See Comments)    White splotches Has patient had a PCN reaction causing immediate rash, facial/tongue/throat swelling, SOB or lightheadedness with hypotension: unknown Has patient had a PCN reaction causing severe rash involving mucus membranes or skin  necrosis: unknown Has patient had a PCN reaction that required hospitalization : drs office Did PCN reaction occurring within the last 10 years: No, childhood allergy If all of the above answers are "NO", then may proceed with Cephalosporin use.   Carlton Adam [Propoxyphene N-Acetaminophen] Nausea And Vomiting  . Latex Other (See Comments)    Irritates on the inside  . Percocet [Oxycodone-Acetaminophen] Nausea And Vomiting  . Promethazine Hcl Other (See Comments)    seizures   Family History  Problem Relation Age of Onset  . COPD Mother   . Hypertension Father   . Heart failure Maternal Grandmother    Social History   Socioeconomic History  . Marital status: Single    Spouse name: Not on file  . Number of children: Not on file  . Years of education: Not on file    . Highest education level: Not on file  Social Needs  . Financial resource strain: Not on file  . Food insecurity - worry: Not on file  . Food insecurity - inability: Not on file  . Transportation needs - medical: Not on file  . Transportation needs - non-medical: Not on file  Occupational History  . Not on file  Tobacco Use  . Smoking status: Never Smoker  . Smokeless tobacco: Never Used  Substance and Sexual Activity  . Alcohol use: No  . Drug use: No  . Sexual activity: No  Other Topics Concern  . Not on file  Social History Narrative  . Not on file   Depression screen [facility] 2/9 02/13/2017 01/27/2017  Decreased Interest 0 1  Down, Depressed, Hopeless 0 1  PHQ - 2 Score 0 2  Altered sleeping - 3  Tired, decreased energy - 3  Change in appetite - 3  Feeling bad or failure about yourself  - 3  Trouble concentrating - 3  Moving slowly or fidgety/restless - 0  Suicidal thoughts - 0  PHQ-9 Score - 17  Difficult doing work/chores - Somewhat difficult    Review of Systems See hpi     Objective:   Physical Exam  Constitutional: She is oriented to person, place, and time. She appears well-developed and well-nourished. No distress.  HENT:  Head: Normocephalic and atraumatic.  Right Ear: External ear normal.  Left Ear: External ear normal.  Eyes: Conjunctivae are normal. No scleral icterus.  Neck: Normal range of motion. Neck supple. No thyromegaly present.  Cardiovascular: Normal rate, regular rhythm, normal heart sounds and intact distal pulses.  Pulmonary/Chest: Effort normal and breath sounds normal. No respiratory distress.  Musculoskeletal: She exhibits no edema.  Lymphadenopathy:    She has no cervical adenopathy.  Neurological: She is alert and oriented to person, place, and time.  Skin: Skin is warm and dry. She is not diaphoretic. No erythema.  Psychiatric: She has a normal mood and affect. Her behavior is normal.          Results for orders placed or  performed in visit on 02/13/17  Vitamin B12  Result Value Ref Range   Vitamin B-12 241 232 - 1,245 pg/mL  VITAMIN D 25 Hydroxy (Vit-D Deficiency, Fractures)  Result Value Ref Range   Vit D, 25-Hydroxy 25.0 (L) 30.0 - 100.0 ng/mL  Ferritin  Result Value Ref Range   Ferritin 22 15 - 150 ng/mL  Sedimentation Rate  Result Value Ref Range   Sed Rate 2 0 - 32 mm/hr  Uric A+ANA+RA Qn+CRP+ASO  Result Value Ref Range   Uric Acid  4.2 2.5 - 7.1 mg/dL   ASO 114.0 0.0 - 200.0 IU/mL   CRP 2.0 0.0 - 4.9 mg/L   Rhuematoid fact SerPl-aCnc <10.0 0.0 - 13.9 IU/mL   Anit Nuclear Antibody(ANA) Negative Negative    Dg Wrist Complete Right  Result Date: 02/13/2017 CLINICAL DATA:  Wrist pain after fall. EXAM: RIGHT WRIST - COMPLETE 3+ VIEW COMPARISON:  Right wrist x-rays dated October 02, 2010. FINDINGS: There is no evidence of fracture or dislocation. There is no evidence of arthropathy or other focal bone abnormality. Probable bone island in the radial styloid, unchanged. Soft tissues are unremarkable. IMPRESSION: Negative. Electronically Signed   By: Titus Dubin M.D.   On: 02/13/2017 16:51    Assessment & Plan:  Vit D def Vit B 12 def Iron def  1. Arthralgia of right knee   2. Arthralgia of right wrist   3. Carpal tunnel syndrome of right wrist - wear brace at night and start systemic prednisone course. Refer for injection.  4. Vitamin D deficiency   5. Bipolar 1 disorder, mixed, moderate (HCC)   6. Anxiety state - will use bzd prn - not daily. Needs OV for refill  7. Attention deficit disorder (ADD) without hyperactivity - needs to see psych - has to many conflicting diagnosis - wanting to restart vyvanse and bzd due to severe anxiety as well as is clealry depressed with h/o Bipolar and PTSD. Asked pt to call to make an appt at [facility] while in the office today  8. Vitamin B12 deficiency   9. Iron deficiency     Orders Placed This Encounter  Procedures  . DG Wrist  Complete Right    Standing Status:   Future    Number of Occurrences:   1    Standing Expiration Date:   02/13/2018    Order Specific Question:   Reason for Exam (SYMPTOM  OR DIAGNOSIS REQUIRED)    Answer:   wrist pain, s/p fall (follow-up)    Order Specific Question:   Is the patient pregnant?    Answer:   No    Order Specific Question:   Preferred imaging location?    Answer:   External  . Vitamin B12  . VITAMIN D 25 Hydroxy (Vit-D Deficiency, Fractures)  . Ferritin  . Sedimentation Rate  . Uric A+ANA+RA Qn+CRP+ASO    Meds ordered this encounter  Medications  . DISCONTD: predniSONE (DELTASONE) 20 MG tablet    Sig: Take 3 tabs qd x 3d, then 2 tabs qd x 3d then 1 tab qd x 3d.    Dispense:  18 tablet    Refill:  0  . predniSONE (DELTASONE) 20 MG tablet    Sig: Take 3 tabs qd x 3d, then 2 tabs qd x 3d then 1 tab qd x 3d.    Dispense:  18 tablet    Refill:  0  . clonazePAM (KLONOPIN) 1 MG tablet    Sig: Take 1 tablet (1 mg total) by mouth at bedtime as needed for anxiety.    Dispense:  30 tablet    Refill:  0     Delman Cheadle, M.D.  Primary Care at [facility] [address] [place], [place] [address] [phone] phone [phone] fax  02/16/17 4:44 PM

## 2017-02-13 NOTE — Patient Instructions (Addendum)
I want you to check out the  [facility]  [address] [place],  [address] [phone] [email or web address]  CALL TODAY BEFORE 6 PM.     IF you received an x-ray today, you will receive an invoice from [facility]. Please contact [facility] at [phone] with questions or concerns regarding your invoice.   IF you received labwork today, you will receive an invoice from [facility]. Please contact [facility] at [phone] with questions or concerns regarding your invoice.   Our billing staff will not be able to assist you with questions regarding bills from these companies.  You will be contacted with the lab results as soon as they are available. The fastest way to get your results is to activate your My Chart account. Instructions are located on the last page of this paperwork. If you have not heard from [place] regarding the results in 2 weeks, please contact this office.      Carpal Tunnel Syndrome Carpal tunnel syndrome is a condition that causes pain in your hand and arm. The carpal tunnel is a narrow area located on the palm side of your wrist. Repeated wrist motion or certain diseases may cause swelling within the tunnel. This swelling pinches the main nerve in the wrist (median nerve). What are the causes? This condition may be caused by:  Repeated wrist motions.  Wrist injuries.  Arthritis.  A cyst or tumor in the carpal tunnel.  Fluid buildup during pregnancy.  Sometimes the cause of this condition is not known. What increases the risk? This condition is more likely to develop in:  People who have jobs that cause them to repeatedly move their wrists in the same motion, such as Art gallery manager.  Women.  People with certain conditions, such as: ? Diabetes. ? Obesity. ? An underactive thyroid (hypothyroidism). ? Kidney failure.  What are the signs or symptoms? Symptoms of this condition  include:  A tingling feeling in your fingers, especially in your thumb, index, and middle fingers.  Tingling or numbness in your hand.  An aching feeling in your entire arm, especially when your wrist and elbow are bent for long periods of time.  Wrist pain that goes up your arm to your shoulder.  Pain that goes down into your palm or fingers.  A weak feeling in your hands. You may have trouble grabbing and holding items.  Your symptoms may feel worse during the night. How is this diagnosed? This condition is diagnosed with a medical history and physical exam. You may also have tests, including:  An electromyogram (EMG). This test measures electrical signals sent by your nerves into the muscles.  X-rays.  How is this treated? Treatment for this condition includes:  Lifestyle changes. It is important to stop doing or modify the activity that caused your condition.  Physical or occupational therapy.  Medicines for pain and inflammation. This may include medicine that is injected into your wrist.  A wrist splint.  Surgery.  Follow these instructions at home: If you have a splint:  Wear it as told by your health care provider. Remove it only as told by your health care provider.  Loosen the splint if your fingers become numb and tingle, or if they turn cold and blue.  Keep the splint clean and dry. General instructions  Take over-the-counter and prescription medicines only as told by your health care provider.  Rest your wrist from any activity that may be causing your pain. If your condition is  work related, talk to your employer about changes that can be made, such as getting a wrist pad to use while typing.  If directed, apply ice to the painful area: ? Put ice in a plastic bag. ? Place a towel between your skin and the bag. ? Leave the ice on for 20 minutes, 2-3 times per day.  Keep all follow-up visits as told by your health care provider. This is  important.  Do any exercises as told by your health care provider, physical therapist, or occupational therapist. Contact a health care provider if:  You have new symptoms.  Your pain is not controlled with medicines.  Your symptoms get worse. This information is not intended to replace advice given to you by your health care provider. Make sure you discuss any questions you have with your health care provider. Document Released: 02/23/2000 Document Revised: 07/06/2015 Document Reviewed: 07/13/2014 Elsevier Interactive Patient Education  2017 [employer].

## 2017-02-14 LAB — URIC A+ANA+RA QN+CRP+ASO
ANA: NEGATIVE
ASO: 114 IU/mL (ref 0.0–200.0)
CRP: 2 mg/L (ref 0.0–4.9)
Uric Acid: 4.2 mg/dL (ref 2.5–7.1)

## 2017-02-14 LAB — FERRITIN: Ferritin: 22 ng/mL (ref 15–150)

## 2017-02-14 LAB — SEDIMENTATION RATE: SED RATE: 2 mm/h (ref 0–32)

## 2017-02-14 LAB — VITAMIN B12: Vitamin B-12: 241 pg/mL (ref 232–1245)

## 2017-02-14 LAB — VITAMIN D 25 HYDROXY (VIT D DEFICIENCY, FRACTURES): Vit D, 25-Hydroxy: 25 ng/mL — ABNORMAL LOW (ref 30.0–100.0)

## 2017-02-15 ENCOUNTER — Encounter: Payer: Self-pay | Admitting: Family Medicine

## 2017-02-16 ENCOUNTER — Encounter: Payer: Self-pay | Admitting: Family Medicine

## 2017-02-17 ENCOUNTER — Encounter: Payer: Self-pay | Admitting: Cardiovascular Disease

## 2017-02-17 DIAGNOSIS — R002 Palpitations: Secondary | ICD-10-CM

## 2017-02-17 DIAGNOSIS — R0789 Other chest pain: Secondary | ICD-10-CM

## 2017-02-17 HISTORY — DX: Other chest pain: R07.89

## 2017-02-17 HISTORY — DX: Palpitations: R00.2

## 2017-02-19 ENCOUNTER — Telehealth: Payer: Self-pay

## 2017-02-19 ENCOUNTER — Telehealth (HOSPITAL_COMMUNITY): Payer: Self-pay | Admitting: *Deleted

## 2017-02-19 NOTE — Telephone Encounter (Signed)
Please advise on labs.   Copied from [place] [id number]. Topic: Quick Communication - Lab Results >> Feb 18, 2017  8:32 AM Scherrie Gerlach wrote: Pt would like a call back concerning the labs done at last visit.  Also would like to know about referral

## 2017-02-19 NOTE — Telephone Encounter (Signed)
Close encounter 

## 2017-02-19 NOTE — Telephone Encounter (Signed)
Labs available on mychart.  Hand surg referral was placed but has not yet been processed since we were closed for snow.

## 2017-02-20 ENCOUNTER — Ambulatory Visit (HOSPITAL_COMMUNITY)
Admission: RE | Admit: 2017-02-20 | Discharge: 2017-02-20 | Disposition: A | Discharge status: Home / Self Care | Payer: BLUE CROSS/BLUE SHIELD | Source: Ambulatory Visit | Attending: Internal Medicine | Admitting: Internal Medicine

## 2017-02-20 DIAGNOSIS — R079 Chest pain, unspecified: Secondary | ICD-10-CM

## 2017-02-20 LAB — EXERCISE TOLERANCE TEST
CHL CUP MPHR: 173 {beats}/min
CHL CUP RESTING HR STRESS: 112 {beats}/min
CSEPEDS: 47 s
CSEPEW: 8.2 METS
CSEPPHR: 187 {beats}/min
Exercise duration (min): 6 min
Percent HR: 108 %
RPE: 17

## 2017-02-24 ENCOUNTER — Telehealth: Payer: Self-pay

## 2017-02-24 ENCOUNTER — Telehealth: Payer: Self-pay | Admitting: Cardiovascular Disease

## 2017-02-24 NOTE — Telephone Encounter (Signed)
Colleen Mccullough is calling wanting to discuss the results of her Stress test from 02/20/17 . Please call

## 2017-02-24 NOTE — Telephone Encounter (Signed)
The stress test was read as normal. It appears that pt did have chest pain symptoms during the stress test but there was no concern that those symptoms were in any way related to her heart.  She needs to talk to cardiology/Dr. Oval Linsey for further info/questions/concerns.

## 2017-02-24 NOTE — Telephone Encounter (Signed)
Returned call to patient.Advised stress test results not available.Advised I will send message to [place].

## 2017-02-24 NOTE — Telephone Encounter (Signed)
Returned call to patient no answer.LMTC. 

## 2017-02-24 NOTE — Telephone Encounter (Signed)
Copied from [place] [id number]. Topic: Quick Communication - Other Results >> Feb 24, 2017  9:05 AM Scherrie Gerlach wrote: Pt calling back to discuss the results of the stress tolerance test.  Concerned about the results and would like to speak with someone asap this morning Ok to leave message.

## 2017-02-24 NOTE — Telephone Encounter (Signed)
Follow  Up   Patient returning call. Please call back.

## 2017-02-26 NOTE — Telephone Encounter (Signed)
Pt advised.

## 2017-02-27 ENCOUNTER — Encounter: Payer: Self-pay | Admitting: Cardiovascular Disease

## 2017-02-27 ENCOUNTER — Telehealth: Payer: Self-pay | Admitting: *Deleted

## 2017-02-27 ENCOUNTER — Encounter: Payer: Self-pay | Admitting: Family Medicine

## 2017-02-27 NOTE — Telephone Encounter (Signed)
F/u message  Pt states she was returning RN call about results. Please call back to discuss

## 2017-02-27 NOTE — Telephone Encounter (Signed)
Spoke with patient and she continues to have the chest pains and shortness of breath she has been having. She stated she was not sure if anxiety related or not. She was concerned with the elevated heart rate and blood pressure noted during stress test. Per patient she is very active and physical at work walking and bending over a lot. She would like to know if Dr Oval Linsey has any further recommendations. Will forward for review   Notes recorded by Alvina Filbert B on 02/27/2017 at 11:42 AM EST Released in my chart with Dr Blenda Mounts comments attached ------  Notes recorded by Skeet Latch, MD on 02/25/2017 at 12:37 PM EST Low risk stress test.

## 2017-02-27 NOTE — Telephone Encounter (Signed)
Patient said she just got off the phone with Elly Modena LPN and received her stress test results.

## 2017-03-03 NOTE — Telephone Encounter (Signed)
Her chest pain is not coming from her heart.  I suggest that she discuss anxiety with her PCP.

## 2017-03-05 NOTE — Telephone Encounter (Signed)
Spoke with pt, aware of dr Blenda Mounts recommendations.

## 2017-03-13 ENCOUNTER — Telehealth: Payer: Self-pay

## 2017-03-13 NOTE — Telephone Encounter (Addendum)
Clonazepam 0.5mg   Last fill 02/13/2017, No follow up scheduled

## 2017-03-24 MED ORDER — CLONAZEPAM 1 MG PO TABS: 1.0000 mg | ORAL_TABLET | Freq: Every evening | ORAL | 0 refills | Status: AC | PRN

## 2017-03-24 NOTE — Addendum Note (Signed)
Addended by: Shawnee Knapp on: 03/24/2017 06:33 PM   Modules accepted: Orders

## 2017-03-24 NOTE — Telephone Encounter (Signed)
Refilled x 1 mo - needs OV for additional refills.

## 2017-04-09 IMAGING — CR DG FOOT COMPLETE 3+V*R*
3 series · 3 of 3 positions shown · non-contrast
Comparison: None.

CLINICAL DATA: Right foot pain for 2 months

EXAM:
RIGHT FOOT COMPLETE - 3+ VIEW

[foot ap]
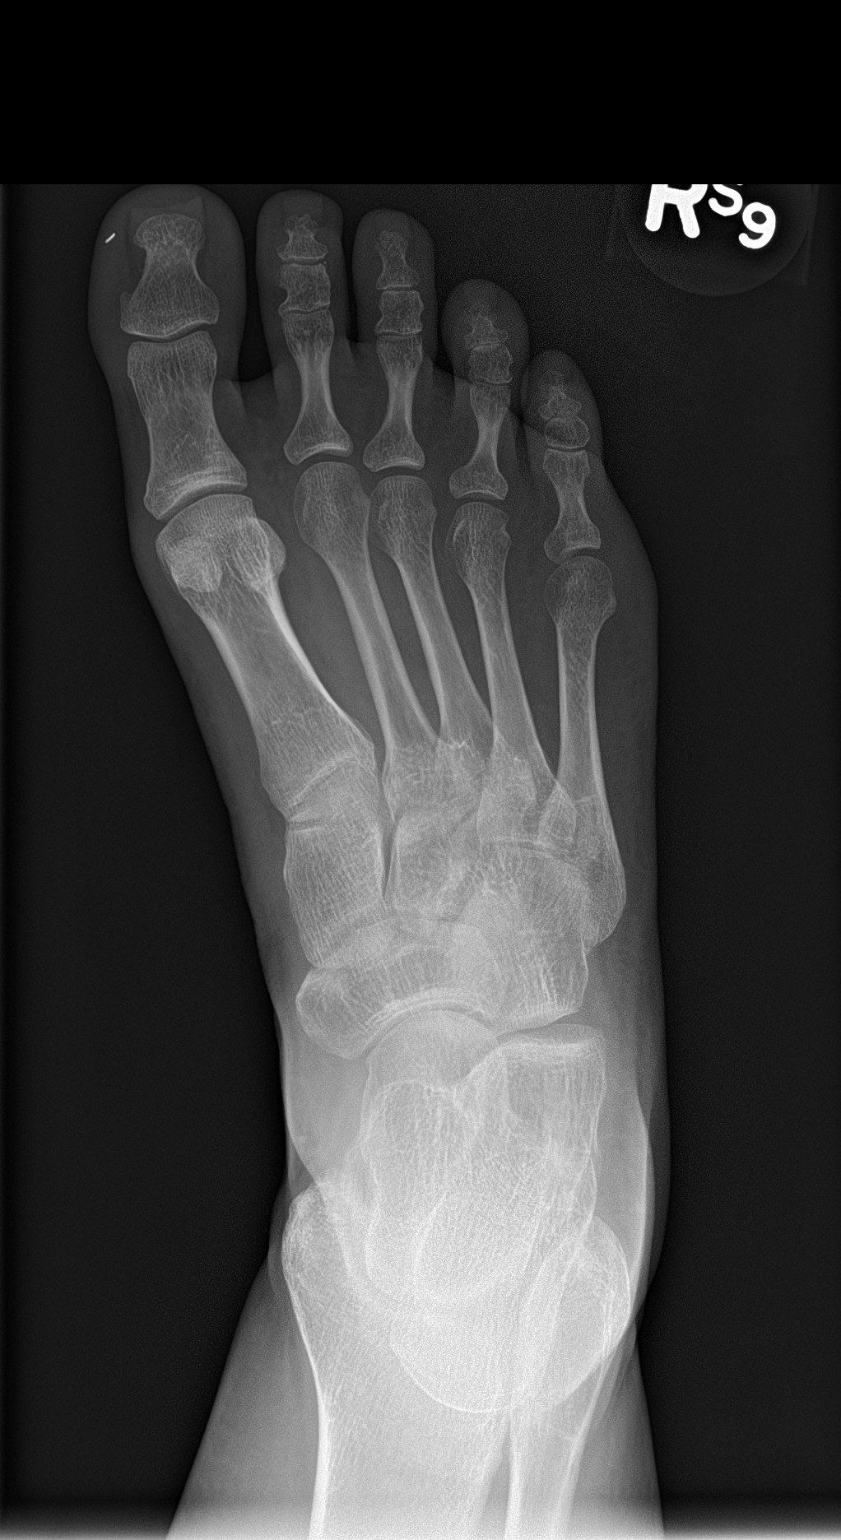

[foot obl]
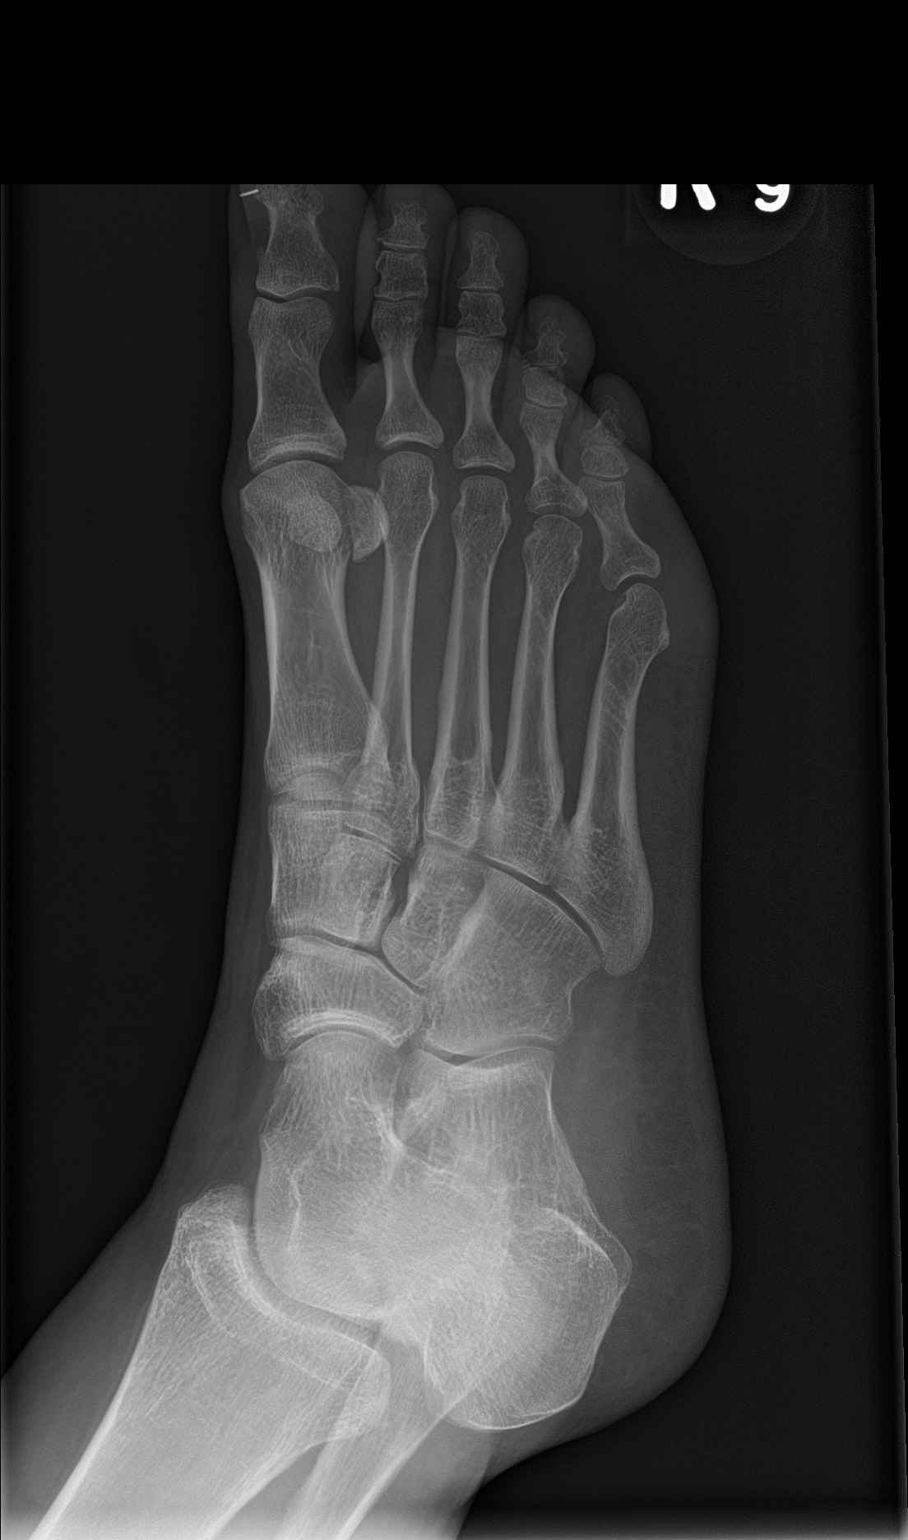

[foot lat]
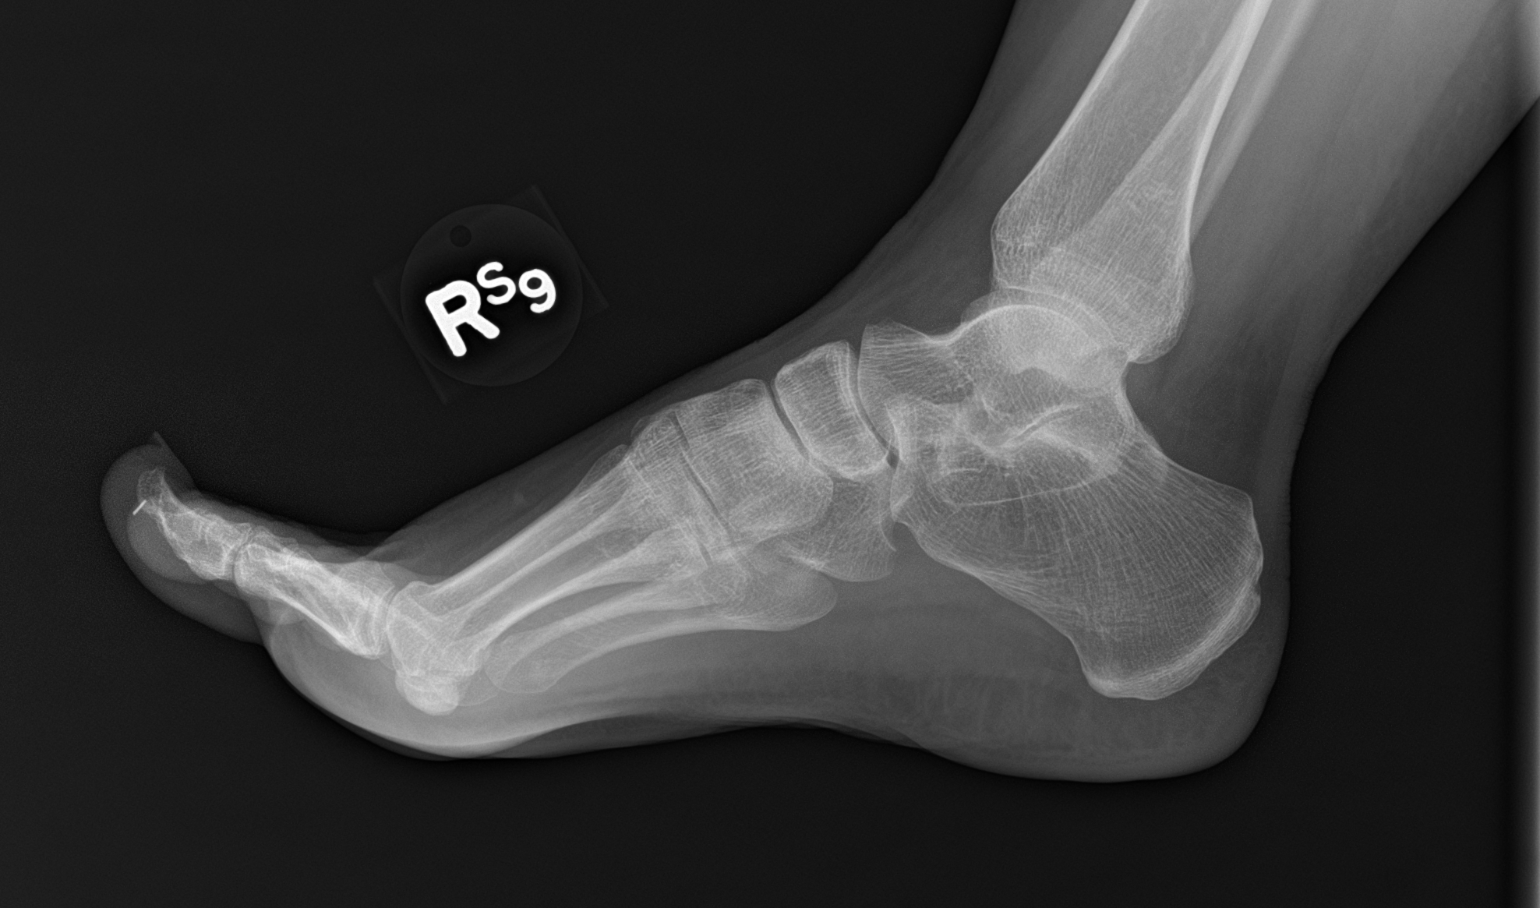

[3 of 3 positions shown; findings below may reference images not displayed]

FINDINGS: Three views of the right foot submitted. No acute fracture or
subluxation. There is a linear at metallic foreign body within soft
tissue just medial to distal phalanx great toe measures 3 mm length.
IMPRESSION: No acute fracture or subluxation. Tiny metallic foreign body within
soft tissue medial to distal phalanx great toe measures about 3 mm.

## 2017-05-02 ENCOUNTER — Telehealth: Payer: Self-pay | Admitting: Cardiovascular Disease

## 2017-05-02 NOTE — Telephone Encounter (Signed)
New message   Pt wants to know why she is scheduled to see [facility]. Please call

## 2017-05-02 NOTE — Telephone Encounter (Signed)
Returned call to patient. Explained that appt was made on 02/12/17 after her MD appt as a routine 2 month follow up. She does not have the means to afford an office visit right now and has no acute concerns - states she still has heart pains but her anxiety is also worse. She cancelled her 2/27 appt and will call back to r/s.   Routed to [facility] LPN as Juluis Rainier

## 2017-05-07 ENCOUNTER — Ambulatory Visit: Payer: BLUE CROSS/BLUE SHIELD | Admitting: Cardiovascular Disease

## 2017-06-17 ENCOUNTER — Telehealth: Payer: Self-pay | Admitting: Family Medicine

## 2017-06-17 NOTE — Telephone Encounter (Signed)
Copied from [place] [id number]. Topic: Quick Communication - See Telephone Encounter >> Jun 17, 2017 12:36 PM Cleaster Corin, NT wrote: CRM for notification. See Telephone encounter for: 06/17/17.  Pt. Calling to have med list faxed to [facility] fax number  [phone]. Pt. Has an appt. On April 15th and fax would need to be sent before appt.

## 2017-06-17 NOTE — Telephone Encounter (Signed)
[facility]

## 2017-08-01 ENCOUNTER — Other Ambulatory Visit: Payer: Self-pay

## 2017-08-01 ENCOUNTER — Encounter: Payer: Self-pay | Admitting: Family Medicine

## 2017-08-01 ENCOUNTER — Ambulatory Visit: Payer: BLUE CROSS/BLUE SHIELD | Admitting: Family Medicine

## 2017-08-01 VITALS — BP 114/78 | HR 100 | Temp 98.5°F | Ht 62.0 in | Wt 126.0 lb

## 2017-08-01 DIAGNOSIS — Z113 Encounter for screening for infections with a predominantly sexual mode of transmission: Secondary | ICD-10-CM | POA: Diagnosis not present

## 2017-08-01 DIAGNOSIS — R1310 Dysphagia, unspecified: Secondary | ICD-10-CM | POA: Diagnosis not present

## 2017-08-01 DIAGNOSIS — Z13228 Encounter for screening for other metabolic disorders: Secondary | ICD-10-CM | POA: Diagnosis not present

## 2017-08-01 DIAGNOSIS — Z13220 Encounter for screening for lipoid disorders: Secondary | ICD-10-CM

## 2017-08-01 DIAGNOSIS — R300 Dysuria: Secondary | ICD-10-CM

## 2017-08-01 DIAGNOSIS — Z1329 Encounter for screening for other suspected endocrine disorder: Secondary | ICD-10-CM

## 2017-08-01 DIAGNOSIS — Z01419 Encounter for gynecological examination (general) (routine) without abnormal findings: Secondary | ICD-10-CM | POA: Diagnosis not present

## 2017-08-01 DIAGNOSIS — Z130 Encounter for screening for diseases of the blood and blood-forming organs and certain disorders involving the immune mechanism: Secondary | ICD-10-CM | POA: Diagnosis not present

## 2017-08-01 NOTE — Patient Instructions (Addendum)
   IF you received an x-ray today, you will receive an invoice from [facility]. Please contact [facility] at [phone] with questions or concerns regarding your invoice.   IF you received labwork today, you will receive an invoice from [facility]. Please contact [facility] at [phone] with questions or concerns regarding your invoice.   Our billing staff will not be able to assist you with questions regarding bills from these companies.  You will be contacted with the lab results as soon as they are available. The fastest way to get your results is to activate your My Chart account. Instructions are located on the last page of this paperwork. If you have not heard from us regarding the results in 2 weeks, please contact this office.     Preventive Care 40-64 Years, Female Preventive care refers to lifestyle choices and visits with your health care provider that can promote health and wellness. What does preventive care include?  A yearly physical exam. This is also called an annual well check.  Dental exams once or twice a year.  Routine eye exams. Ask your health care provider how often you should have your eyes checked.  Personal lifestyle choices, including: ? Daily care of your teeth and gums. ? Regular physical activity. ? Eating a healthy diet. ? Avoiding tobacco and drug use. ? Limiting alcohol use. ? Practicing safe sex. ? Taking low-dose aspirin daily starting at age 50. ? Taking vitamin and mineral supplements as recommended by your health care provider. What happens during an annual well check? The services and screenings done by your health care provider during your annual well check will depend on your age, overall health, lifestyle risk factors, and family history of disease. Counseling Your health care provider may ask you questions about your:  Alcohol use.  Tobacco use.  Drug use.  Emotional well-being.  Home and relationship  well-being.  Sexual activity.  Eating habits.  Work and work environment.  Method of birth control.  Menstrual cycle.  Pregnancy history.  Screening You may have the following tests or measurements:  Height, weight, and BMI.  Blood pressure.  Lipid and cholesterol levels. These may be checked every 5 years, or more frequently if you are over 50 years old.  Skin check.  Lung cancer screening. You may have this screening every year starting at age 55 if you have a 30-pack-year history of smoking and currently smoke or have quit within the past 15 years.  Fecal occult blood test (FOBT) of the stool. You may have this test every year starting at age 50.  Flexible sigmoidoscopy or colonoscopy. You may have a sigmoidoscopy every 5 years or a colonoscopy every 10 years starting at age 50.  Hepatitis C blood test.  Hepatitis B blood test.  Sexually transmitted disease (STD) testing.  Diabetes screening. This is done by checking your blood sugar (glucose) after you have not eaten for a while (fasting). You may have this done every 1-3 years.  Mammogram. This may be done every 1-2 years. Talk to your health care provider about when you should start having regular mammograms. This may depend on whether you have a family history of breast cancer.  BRCA-related cancer screening. This may be done if you have a family history of breast, ovarian, tubal, or peritoneal cancers.  Pelvic exam and Pap test. This may be done every 3 years starting at age 21. Starting at age 30, this may be done every 5 years if you   have a Pap test in combination with an HPV test.  Bone density scan. This is done to screen for osteoporosis. You may have this scan if you are at high risk for osteoporosis.  Discuss your test results, treatment options, and if necessary, the need for more tests with your health care provider. Vaccines Your health care provider may recommend certain vaccines, such  as:  Influenza vaccine. This is recommended every year.  Tetanus, diphtheria, and acellular pertussis (Tdap, Td) vaccine. You may need a Td booster every 10 years.  Varicella vaccine. You may need this if you have not been vaccinated.  Zoster vaccine. You may need this after age 60.  Measles, mumps, and rubella (MMR) vaccine. You may need at least one dose of MMR if you were born in 1957 or later. You may also need a second dose.  Pneumococcal 13-valent conjugate (PCV13) vaccine. You may need this if you have certain conditions and were not previously vaccinated.  Pneumococcal polysaccharide (PPSV23) vaccine. You may need one or two doses if you smoke cigarettes or if you have certain conditions.  Meningococcal vaccine. You may need this if you have certain conditions.  Hepatitis A vaccine. You may need this if you have certain conditions or if you travel or work in places where you may be exposed to hepatitis A.  Hepatitis B vaccine. You may need this if you have certain conditions or if you travel or work in places where you may be exposed to hepatitis B.  Haemophilus influenzae type b (Hib) vaccine. You may need this if you have certain conditions.  Talk to your health care provider about which screenings and vaccines you need and how often you need them. This information is not intended to replace advice given to you by your health care provider. Make sure you discuss any questions you have with your health care provider. Document Released: 03/24/2015 Document Revised: 11/15/2015 Document Reviewed: 12/27/2014 Elsevier Interactive Patient Education  2018 [employer]  

## 2017-08-01 NOTE — Progress Notes (Signed)
5/24/20193:51 PM  ROMELLE MULDOON May 03, 1970, 47 y.o. female [id number]  Chief Complaint  Patient presents with  . Gynecologic Exam    wants STI  test and to be checked for UTI    HPI:   Patient is a 47 y.o. female with past medical history significant for bipolar disorder, add, asthma, migraines who presents today for gyn exam with std testing  Last pap with hpv in 2014, negative No fhx breast or ovarian cancer, no breast concerns today. Mammograms at age 54 Reports regular normal menses, denies any sign sx of perimenopause transition S/p BTL Requesting STD testing, denies any symptoms  Fall Risk  08/01/2017 02/13/2017 01/27/2017  Falls in the past year? No No No     Depression screen [facility] 2/9 08/01/2017 02/13/2017 01/27/2017  Decreased Interest 0 0 1  Down, Depressed, Hopeless 0 0 1  PHQ - 2 Score 0 0 2  Altered sleeping - - 3  Tired, decreased energy - - 3  Change in appetite - - 3  Feeling bad or failure about yourself  - - 3  Trouble concentrating - - 3  Moving slowly or fidgety/restless - - 0  Suicidal thoughts - - 0  PHQ-9 Score - - 17  Difficult doing work/chores - - Somewhat difficult    Allergies  Allergen Reactions  . Amoxicillin Other (See Comments)    White splotches Has patient had a PCN reaction causing immediate rash, facial/tongue/throat swelling, SOB or lightheadedness with hypotension: unknown Has patient had a PCN reaction causing severe rash involving mucus membranes or skin necrosis: unknown Has patient had a PCN reaction that required hospitalization : drs office Did PCN reaction occurring within the last 10 years: No, childhood allergy If all of the above answers are "NO", then may proceed with Cephalosporin use.   Carlton Adam [Propoxyphene N-Acetaminophen] Nausea And Vomiting  . Latex Other (See Comments)    Irritates on the inside  . Percocet [Oxycodone-Acetaminophen] Nausea And Vomiting  . Promethazine Hcl Other (See Comments)   seizures    Prior to Admission medications   Medication Sig Start Date End Date Taking? Authorizing Provider  clonazePAM (KLONOPIN) 1 MG tablet Take 1 tablet (1 mg total) by mouth at bedtime as needed for anxiety. 03/24/17  Yes Shawnee Knapp, MD  predniSONE (DELTASONE) 20 MG tablet Take 3 tabs qd x 3d, then 2 tabs qd x 3d then 1 tab qd x 3d. Patient not taking: Reported on 08/01/2017 02/13/17   Shawnee Knapp, MD  topiramate (TOPAMAX) 50 MG tablet Take 1 tablet (50 mg total) by mouth daily. Patient not taking: Reported on 08/01/2017 09/02/16   Robyn Haber, MD    Past Medical History:  Diagnosis Date  . ADD (attention deficit disorder)   . Asthma   . Atypical chest pain 02/17/2017  . Bipolar 1 disorder ([place])   . Cancer ([place])   . Depression   . Hyperglycemia   . Hypoglycemia   . Migraines   . Palpitations 02/17/2017    Past Surgical History:  Procedure Laterality Date  . CESAREAN SECTION    . TUBAL LIGATION      Social History   Tobacco Use  . Smoking status: Never Smoker  . Smokeless tobacco: Never Used  Substance Use Topics  . Alcohol use: No    Family History  Problem Relation Age of Onset  . COPD Mother   . Hypertension Father   . Heart failure Maternal Grandmother  Review of Systems  Constitutional: Negative for chills, fever, malaise/fatigue and weight loss.  HENT: Negative for congestion, ear pain and sore throat.   Eyes: Negative for blurred vision, pain and discharge.  Respiratory: Negative for cough and shortness of breath.   Cardiovascular: Negative for chest pain, palpitations and leg swelling.  Gastrointestinal: Negative for abdominal pain, blood in stool, constipation, diarrhea, heartburn, melena, nausea and vomiting.       Reports feeling of solids and liquids getting stuck at end of throat. Needs to really chew her food and be very mindful of swallowing.   Genitourinary: Negative for dysuria, frequency and hematuria.       Neg breast lumps or  nipple discharge Neg vaginal discharge, pelvic pain, dyspareunia, abnormal vaginal bleeding  Musculoskeletal: Positive for joint pain.  Neurological: Negative for dizziness and focal weakness.  Endo/Heme/Allergies: Negative for polydipsia.  Psychiatric/Behavioral: Negative for suicidal ideas.  All other systems reviewed and are negative.    OBJECTIVE:  Blood pressure 114/78, pulse 100, temperature 98.5 F (36.9 C), temperature source Oral, height 5\' 2"  (1.575 m), weight 126 lb (57.2 kg), last menstrual period 06/30/2017, SpO2 100 %.  Wt Readings from Last 3 Encounters:  08/07/17 125 lb 6.4 oz (56.9 kg)  08/01/17 126 lb (57.2 kg)  02/12/17 130 lb (59 kg)    Physical Exam  Constitutional: She is oriented to person, place, and time. She appears well-developed and well-nourished.  HENT:  Head: Normocephalic and atraumatic.  Right Ear: Hearing, tympanic membrane, external ear and ear canal normal.  Left Ear: Hearing, tympanic membrane, external ear and ear canal normal.  Mouth/Throat: Oropharynx is clear and moist.  Eyes: Pupils are equal, round, and reactive to light. Conjunctivae and EOM are normal.  Neck: Neck supple. No thyromegaly present.  Cardiovascular: Normal rate, regular rhythm, normal heart sounds and intact distal pulses. Exam reveals no gallop and no friction rub.  No murmur heard. Pulmonary/Chest: Effort normal and breath sounds normal. She has no wheezes. She has no rhonchi. She has no rales. Right breast exhibits no inverted nipple, no mass, no nipple discharge, no skin change and no tenderness. Left breast exhibits no inverted nipple, no mass, no nipple discharge, no skin change and no tenderness. Breasts are symmetrical.  Abdominal: Soft. Bowel sounds are normal. She exhibits no distension. There is no hepatosplenomegaly. There is no tenderness. There is no guarding.  Genitourinary: There is no rash or lesion on the right labia. There is no rash or lesion on the left  labia. Uterus is not enlarged, not fixed and not tender. Cervix exhibits no motion tenderness, no discharge and no friability. Right adnexum displays no mass and no tenderness. Left adnexum displays no mass and no tenderness. No erythema in the vagina. No vaginal discharge found.  Musculoskeletal: Normal range of motion. She exhibits no edema.  Lymphadenopathy:    She has no cervical adenopathy.    She has no axillary adenopathy.       Right: No supraclavicular adenopathy present.       Left: No supraclavicular adenopathy present.  Neurological: She is alert and oriented to person, place, and time. She has normal reflexes.  Skin: Skin is warm and dry.  Psychiatric: She has a normal mood and affect.  Nursing note and vitals reviewed.    ASSESSMENT and PLAN  1. Encounter for annual routine gynecological examination Routine HCM labs ordered. HCM reviewed/discussed. Anticipatory guidance regarding healthy weight, lifestyle and choices given.   - Pap IG and HPV (  high risk) DNA detection  2. Screen for STD (sexually transmitted disease) - HCV Ab w Reflex to Quant PCR - HIV antibody - RPR - Trichomonas vaginalis, RNA - GC/Chlamydia Probe Amp([facility])  3. Dysuria - Urine Culture  4. Screening for deficiency anemia - CBC with Differential/Platelet  5. Screening for thyroid disorder - TSH  6. Screening for metabolic disorder - Comprehensive metabolic panel  7. Screening for lipid disorders - Lipid panel  8. Dysphagia, unspecified type - SLP modified barium swallow  Other orders - Interpretation:  Return in about 1 year (around 08/02/2018).    Rutherford Guys, MD Primary Care at [place], [place] [address] Ph.  [phone] Fax [phone]

## 2017-08-02 LAB — COMPREHENSIVE METABOLIC PANEL
ALT: 12 IU/L (ref 0–32)
AST: 17 IU/L (ref 0–40)
Albumin/Globulin Ratio: 1.9 (ref 1.2–2.2)
Albumin: 4.5 g/dL (ref 3.5–5.5)
Alkaline Phosphatase: 87 IU/L (ref 39–117)
BUN/Creatinine Ratio: 28 — ABNORMAL HIGH (ref 9–23)
BUN: 22 mg/dL (ref 6–24)
Bilirubin Total: 0.3 mg/dL (ref 0.0–1.2)
CO2: 20 mmol/L (ref 20–29)
Calcium: 9.2 mg/dL (ref 8.7–10.2)
Chloride: 104 mmol/L (ref 96–106)
Creatinine, Ser: 0.8 mg/dL (ref 0.57–1.00)
GFR calc Af Amer: 102 mL/min/{1.73_m2} (ref >59)
GFR calc non Af Amer: 88 mL/min/{1.73_m2} (ref >59)
Globulin, Total: 2.4 g/dL (ref 1.5–4.5)
Glucose: 88 mg/dL (ref 65–99)
Potassium: 4 mmol/L (ref 3.5–5.2)
Sodium: 140 mmol/L (ref 134–144)
Total Protein: 6.9 g/dL (ref 6.0–8.5)

## 2017-08-02 LAB — HCV AB W REFLEX TO QUANT PCR: HCV Ab: <0.1 s/co ratio (ref 0.0–0.9)

## 2017-08-02 LAB — CBC WITH DIFFERENTIAL/PLATELET
Basophils Absolute: 0 10*3/uL (ref 0.0–0.2)
Basos: 0 %
EOS (ABSOLUTE): 0.1 10*3/uL (ref 0.0–0.4)
Eos: 1 %
Hematocrit: 37.4 % (ref 34.0–46.6)
Hemoglobin: 12.7 g/dL (ref 11.1–15.9)
Immature Grans (Abs): 0 10*3/uL (ref 0.0–0.1)
Immature Granulocytes: 0 %
Lymphocytes Absolute: 2.2 10*3/uL (ref 0.7–3.1)
Lymphs: 24 %
MCH: 29.5 pg (ref 26.6–33.0)
MCHC: 34 g/dL (ref 31.5–35.7)
MCV: 87 fL (ref 79–97)
Monocytes Absolute: 0.8 10*3/uL (ref 0.1–0.9)
Monocytes: 9 %
Neutrophils Absolute: 6.1 10*3/uL (ref 1.4–7.0)
Neutrophils: 66 %
Platelets: 269 10*3/uL (ref 150–450)
RBC: 4.31 x10E6/uL (ref 3.77–5.28)
RDW: 13.9 % (ref 12.3–15.4)
WBC: 9.2 10*3/uL (ref 3.4–10.8)

## 2017-08-02 LAB — HIV ANTIBODY (ROUTINE TESTING W REFLEX): HIV Screen 4th Generation wRfx: NONREACTIVE

## 2017-08-02 LAB — LIPID PANEL
Chol/HDL Ratio: 2.9 ratio (ref 0.0–4.4)
Cholesterol, Total: 152 mg/dL (ref 100–199)
HDL: 52 mg/dL (ref >39)
LDL Calculated: 86 mg/dL (ref 0–99)
Triglycerides: 72 mg/dL (ref 0–149)
VLDL Cholesterol Cal: 14 mg/dL (ref 5–40)

## 2017-08-02 LAB — URINE CULTURE

## 2017-08-02 LAB — RPR: RPR Ser Ql: NONREACTIVE

## 2017-08-02 LAB — TSH: TSH: 1.02 u[IU]/mL (ref 0.450–4.500)

## 2017-08-02 LAB — HCV INTERPRETATION

## 2017-08-04 ENCOUNTER — Encounter: Payer: Self-pay | Admitting: Family Medicine

## 2017-08-04 LAB — TRICHOMONAS VAGINALIS, PROBE AMP: Trich vag by NAA: NEGATIVE

## 2017-08-04 LAB — GC/CHLAMYDIA PROBE AMP
Chlamydia trachomatis, NAA: NEGATIVE
Neisseria gonorrhoeae by PCR: NEGATIVE

## 2017-08-05 NOTE — Telephone Encounter (Signed)
Please look into this patient's pap as it still shows active. It should have been processed on day of exam 08/01/17. thanks

## 2017-08-06 LAB — PAP IG AND HPV HIGH-RISK
HPV, high-risk: NEGATIVE
PAP Smear Comment: 0

## 2017-08-07 ENCOUNTER — Encounter: Payer: Self-pay | Admitting: Family Medicine

## 2017-08-07 ENCOUNTER — Ambulatory Visit: Payer: BLUE CROSS/BLUE SHIELD | Admitting: Family Medicine

## 2017-08-07 ENCOUNTER — Other Ambulatory Visit: Payer: Self-pay

## 2017-08-07 VITALS — BP 108/62 | HR 100 | Temp 97.9°F | Ht 62.0 in | Wt 125.4 lb

## 2017-08-07 DIAGNOSIS — M7062 Trochanteric bursitis, left hip: Secondary | ICD-10-CM

## 2017-08-07 DIAGNOSIS — M7632 Iliotibial band syndrome, left leg: Secondary | ICD-10-CM | POA: Diagnosis not present

## 2017-08-07 DIAGNOSIS — F988 Other specified behavioral and emotional disorders with onset usually occurring in childhood and adolescence: Secondary | ICD-10-CM | POA: Diagnosis not present

## 2017-08-07 MED ORDER — MELOXICAM 15 MG PO TABS: 15.0000 mg | ORAL_TABLET | Freq: Every day | ORAL | 2 refills | Status: DC

## 2017-08-07 MED ORDER — LISDEXAMFETAMINE DIMESYLATE 20 MG PO CAPS: 20.0000 mg | ORAL_CAPSULE | Freq: Every day | ORAL | 0 refills | Status: DC

## 2017-08-07 NOTE — Patient Instructions (Signed)
     IF you received an x-ray today, you will receive an invoice from [facility]. Please contact [facility] at [phone] with questions or concerns regarding your invoice.   IF you received labwork today, you will receive an invoice from [facility]. Please contact [facility] at [phone] with questions or concerns regarding your invoice.   Our billing staff will not be able to assist you with questions regarding bills from these companies.  You will be contacted with the lab results as soon as they are available. The fastest way to get your results is to activate your My Chart account. Instructions are located on the last page of this paperwork. If you have not heard from us regarding the results in 2 weeks, please contact this office.     

## 2017-08-07 NOTE — Progress Notes (Signed)
5/30/20193:15 PM  Colleen Mccullough 04-30-1970, 47 y.o. female [id number]  Chief Complaint  Patient presents with  . Follow-up    wants to talk about the xray taken last year on her back. Also, would also like to get back on Vyvanse    HPI:   Patient is a 47 y.o. female with past medical history significant for ADD who presents today to discuss getting back on vyvanse and left side hip/thigh pain  ADD diagnosed almost 20 years ago, has not been under treatment for several years as she was uninsured  Last treated at [facility], vyvanse, she did well with it, was able to focus and complete tasks She feels that through the years her inability to focus and pay attention has become worse, she is having more issues at work, she would like to restart vyvanse [place] [facility] reviewed  She reports intermittent left hip/thigh pain, this last flare up started about 6 months ago, she states that pain is worse when she walks. Sometimes radiates down to just above her knee. She denies any numbness or tingling. Ct scan of left hip and lumbar spine in 2017 were unrevealing. 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 She has not done anything for this yet.  Fall Risk  08/07/2017 08/01/2017 02/13/2017 01/27/2017  Falls in the past year? No No No No     Depression screen [facility] 2/9 08/07/2017 08/01/2017 02/13/2017  Decreased Interest 0 0 0  Down, Depressed, Hopeless 0 0 0  PHQ - 2 Score 0 0 0  Altered sleeping - - -  Tired, decreased energy - - -  Change in appetite - - -  Feeling bad or failure about yourself  - - -  Trouble concentrating - - -  Moving slowly or fidgety/restless - - -  Suicidal thoughts - - -  PHQ-9 Score - - -  Difficult doing work/chores - - -    Allergies  Allergen Reactions  . Amoxicillin Other (See Comments)    White splotches Has patient had a PCN reaction causing immediate rash, facial/tongue/throat swelling, SOB or lightheadedness with  hypotension: unknown Has patient had a PCN reaction causing severe rash involving mucus membranes or skin necrosis: unknown Has patient had a PCN reaction that required hospitalization : drs office Did PCN reaction occurring within the last 10 years: No, childhood allergy If all of the above answers are "NO", then may proceed with Cephalosporin use.   Carlton Adam [Propoxyphene N-Acetaminophen] Nausea And Vomiting  . Latex Other (See Comments)    Irritates on the inside  . Percocet [Oxycodone-Acetaminophen] Nausea And Vomiting  . Promethazine Hcl Other (See Comments)    seizures    Prior to Admission medications   Medication Sig Start Date End Date Taking? Authorizing Provider  clonazePAM (KLONOPIN) 1 MG tablet Take 1 tablet (1 mg total) by mouth at bedtime as needed for anxiety. 03/24/17  Yes Shawnee Knapp, MD  predniSONE (DELTASONE) 20 MG tablet Take 3 tabs qd x 3d, then 2 tabs qd x 3d then 1 tab qd x 3d. 02/13/17  Yes Shawnee Knapp, MD  topiramate (TOPAMAX) 50 MG tablet Take 1 tablet (50 mg total) by mouth daily. 09/02/16  Yes Robyn Haber, MD    Past Medical History:  Diagnosis Date  . ADD (attention deficit disorder)   . Asthma   . Atypical chest pain 02/17/2017  . Bipolar 1 disorder ([place])   . Cancer ([place])   .  Depression   . Hyperglycemia   . Hypoglycemia   . Migraines   . Palpitations 02/17/2017    Past Surgical History:  Procedure Laterality Date  . CESAREAN SECTION    . TUBAL LIGATION      Social History   Tobacco Use  . Smoking status: Never Smoker  . Smokeless tobacco: Never Used  Substance Use Topics  . Alcohol use: No    Family History  Problem Relation Age of Onset  . COPD Mother   . Hypertension Father   . Heart failure Maternal Grandmother     Review of Systems  Constitutional: Negative for chills, fever and malaise/fatigue.  Musculoskeletal: Positive for back pain and joint pain.  Neurological: Negative for tingling and focal weakness.     OBJECTIVE:  Blood pressure 108/62, pulse 100, temperature 97.9 F (36.6 C), temperature source Oral, height 5\' 2"  (1.575 m), weight 125 lb 6.4 oz (56.9 kg), last menstrual period 08/03/2017, SpO2 99 %.  Physical Exam  Constitutional: She is oriented to person, place, and time.  HENT:  Head: Normocephalic and atraumatic.  Mouth/Throat: Oropharynx is clear and moist. No oropharyngeal exudate.  Eyes: Pupils are equal, round, and reactive to light. EOM are normal. No scleral icterus.  Neck: Neck supple.  Cardiovascular: Normal rate, regular rhythm and normal heart sounds. Exam reveals no gallop and no friction rub.  No murmur heard. Pulmonary/Chest: Effort normal and breath sounds normal. She has no wheezes. She has no rales.  Musculoskeletal: She exhibits no edema.  Left HIP FROM, TTP over GT and IT band   Neurological: She is alert and oriented to person, place, and time.  Normal strength and reflexes of lower extremities Neg SLR  Skin: Skin is warm and dry.    EKG done in Nov 2018 was normal    ASSESSMENT and PLAN  1. Greater trochanteric bursitis of left hip 2. It band syndrome, left  - Ambulatory referral to Physical Therapy - meloxicam (MOBIC) 15 MG tablet; Take 1 tablet (15 mg total) by mouth daily. Take with food or milk   3. Attention deficit disorder (ADD) without hyperactivity - ToxASSURE Select 13 (MW), Urine - lisdexamfetamine (VYVANSE) 20 MG capsule; Take 1 capsule (20 mg total) by mouth daily. - CSA completed  Return in about 1 month (around 09/04/2017).    Rutherford Guys, MD Primary Care at [place], [place] [address] Ph.  [phone] Fax [phone]

## 2017-08-13 LAB — TOXASSURE SELECT 13 (MW), URINE

## 2017-08-19 ENCOUNTER — Ambulatory Visit: Payer: Self-pay | Admitting: Physical Therapy

## 2017-08-21 ENCOUNTER — Telehealth: Payer: Self-pay | Admitting: Family Medicine

## 2017-08-21 MED ORDER — LISDEXAMFETAMINE DIMESYLATE 30 MG PO CAPS: 30.0000 mg | ORAL_CAPSULE | Freq: Every day | ORAL | 0 refills | Status: DC

## 2017-08-21 NOTE — Telephone Encounter (Signed)
Copied from [place] [id number]. Topic: Quick Communication - Rx Refill/Question >> Aug 21, 2017 12:50 PM Lysle Morales L, NT wrote: Medication:   lisdexamfetamine (VYVANSE) 20 MG capsule  20 MG capsule   Has the patient contacted their pharmacy? yes  (Agent: If no, request that the patient contact the pharmacy for the refill. (Agent: If yes, when and what did the pharmacy advise? Preferred Pharmacy (with phone number or street name [id number] - [place], [place] [phone] (Phone) [phone] (Fax)     Agent: Please be advised that RX refills may take up to 3 business days. We ask that you follow-up with your pharmacy. Patient called and would like to know if the dose of the lisdexamfetamine (VYVANSE) 20 MG capsule can be increased to what the next small increase would be , please advise [phone]

## 2017-08-21 NOTE — Telephone Encounter (Signed)
Increased to 30mg . rx sen to [facility] on record

## 2017-08-21 NOTE — Telephone Encounter (Signed)
Message re: increasing Vyvanse 20mg  sent to [place]

## 2017-08-21 NOTE — Addendum Note (Signed)
Addended by: Rutherford Guys on: 08/21/2017 01:48 PM   Modules accepted: Orders

## 2017-08-25 ENCOUNTER — Other Ambulatory Visit (HOSPITAL_COMMUNITY): Payer: Self-pay | Admitting: Family Medicine

## 2017-08-25 DIAGNOSIS — R1310 Dysphagia, unspecified: Secondary | ICD-10-CM

## 2017-09-01 ENCOUNTER — Ambulatory Visit (HOSPITAL_COMMUNITY): Payer: Self-pay

## 2017-09-01 ENCOUNTER — Ambulatory Visit (HOSPITAL_COMMUNITY)
Admission: RE | Admit: 2017-09-01 | Discharge: 2017-09-01 | Disposition: A | Discharge status: Home / Self Care | Payer: Self-pay | Source: Ambulatory Visit | Attending: Family Medicine | Admitting: Family Medicine

## 2017-09-01 DIAGNOSIS — R1310 Dysphagia, unspecified: Secondary | ICD-10-CM

## 2017-09-15 ENCOUNTER — Other Ambulatory Visit: Payer: Self-pay

## 2017-09-15 ENCOUNTER — Encounter: Payer: Self-pay | Admitting: Family Medicine

## 2017-09-15 ENCOUNTER — Ambulatory Visit (INDEPENDENT_AMBULATORY_CARE_PROVIDER_SITE_OTHER): Payer: BLUE CROSS/BLUE SHIELD | Admitting: Family Medicine

## 2017-09-15 ENCOUNTER — Ambulatory Visit: Payer: BLUE CROSS/BLUE SHIELD | Admitting: Family Medicine

## 2017-09-15 VITALS — BP 102/84 | HR 100 | Temp 98.5°F | Ht 62.0 in | Wt 114.4 lb

## 2017-09-15 DIAGNOSIS — M7062 Trochanteric bursitis, left hip: Secondary | ICD-10-CM

## 2017-09-15 DIAGNOSIS — R350 Frequency of micturition: Secondary | ICD-10-CM

## 2017-09-15 DIAGNOSIS — F988 Other specified behavioral and emotional disorders with onset usually occurring in childhood and adolescence: Secondary | ICD-10-CM | POA: Diagnosis not present

## 2017-09-15 DIAGNOSIS — M549 Dorsalgia, unspecified: Secondary | ICD-10-CM | POA: Diagnosis not present

## 2017-09-15 LAB — POCT URINALYSIS DIP (MANUAL ENTRY)
Blood, UA: NEGATIVE
Glucose, UA: NEGATIVE mg/dL
Leukocytes, UA: NEGATIVE
Nitrite, UA: NEGATIVE
Protein Ur, POC: NEGATIVE mg/dL
Spec Grav, UA: >=1.03 — AB (ref 1.010–1.025)
Urobilinogen, UA: 0.2 E.U./dL
pH, UA: 5 (ref 5.0–8.0)

## 2017-09-15 MED ORDER — LISDEXAMFETAMINE DIMESYLATE 40 MG PO CAPS: 40.0000 mg | ORAL_CAPSULE | Freq: Every day | ORAL | 0 refills | Status: DC

## 2017-09-15 MED ORDER — IBUPROFEN 600 MG PO TABS: 600.0000 mg | ORAL_TABLET | Freq: Three times a day (TID) | ORAL | 1 refills | Status: DC | PRN

## 2017-09-15 NOTE — Patient Instructions (Signed)
If doing well on vyvanse 40mg  daily, I fill see you in 3 months. I can refill medications via mychart.

## 2017-09-15 NOTE — Progress Notes (Signed)
7/8/201912:29 PM  Colleen Mccullough [birth year], 47 y.o. female [id number]  Chief Complaint  Patient presents with  . Urinary Frequency    ongoing for 4 days, no pain or burning  . Pain    left shoulder pain thinks its because she slept wrong  . Medication Problem    asking for increase on Vyvanse medication, not feeling the effect as inthe beginning when taking the medication    HPI:   Patient is a 47 y.o. female with past medical history significant for ADHD who presents today for followup  Increased vyvanse to 30mg  once a day for about 3 weeks ago Overall better than 20mg , improved focus and concentration, able to get tasks done earlier in the day however not lasting until late afternoon, early evening when she is trying to run errands, get ready for the next day, etc She works from [employer] to [employer], takes very vyvanse around [employer].  She has noticed some decreased appetite and has lost some weight, she is planning on starting protein/nutritional shakes to keep weight stable It has not made her anxiety worse Has not caused any palpitations or insomnia  She otherwise has had some urinary frequency past several days, denies any suprapubic pressure, dysuria or hematuria She is not drinking as much water as normal  She also is not going to be able to do PT for her left hip, copay per visit of $25. meloxicam not helping as much Would prefer ibuprofen Left shoulder/upper back hurts. She slept wrong last night Boyfriend tried tens unit and it was causing more pain Has not tried anything else  Fall Risk  09/15/2017 08/07/2017 08/01/2017 02/13/2017 01/27/2017  Falls in the past year? No No No No No     Depression screen [facility] 2/9 09/15/2017 08/07/2017 08/01/2017  Decreased Interest 0 0 0  Down, Depressed, Hopeless 0 0 0  PHQ - 2 Score 0 0 0  Altered sleeping - - -  Tired, decreased energy - - -  Change in appetite - - -  Feeling bad or failure about yourself  - - -  Trouble concentrating  - - -  Moving slowly or fidgety/restless - - -  Suicidal thoughts - - -  PHQ-9 Score - - -  Difficult doing work/chores - - -    Allergies  Allergen Reactions  . Amoxicillin Other (See Comments)    White splotches Has patient had a PCN reaction causing immediate rash, facial/tongue/throat swelling, SOB or lightheadedness with hypotension: unknown Has patient had a PCN reaction causing severe rash involving mucus membranes or skin necrosis: unknown Has patient had a PCN reaction that required hospitalization : drs office Did PCN reaction occurring within the last 10 years: No, childhood allergy If all of the above answers are "NO", then may proceed with Cephalosporin use.   Carlton Adam [Propoxyphene N-Acetaminophen] Nausea And Vomiting  . Latex Other (See Comments)    Irritates on the inside  . Percocet [Oxycodone-Acetaminophen] Nausea And Vomiting  . Promethazine Hcl Other (See Comments)    seizures    Prior to Admission medications   Medication Sig Start Date End Date Taking? Authorizing Provider  clonazePAM (KLONOPIN) 1 MG tablet Take 1 tablet (1 mg total) by mouth at bedtime as needed for anxiety. 03/24/17  Yes Shawnee Knapp, MD  lisdexamfetamine (VYVANSE) 30 MG capsule Take 1 capsule (30 mg total) by mouth daily. 08/21/17  Yes Rutherford Guys, MD  meloxicam (MOBIC) 15 MG tablet Take 1 tablet (15  mg total) by mouth daily. Take with food or milk 08/07/17  Yes Rutherford Guys, MD    Past Medical History:  Diagnosis Date  . ADD (attention deficit disorder)   . Asthma   . Atypical chest pain 02/17/2017  . Bipolar 1 disorder ([place])   . Cancer ([place])   . Depression   . Hyperglycemia   . Hypoglycemia   . Migraines   . Palpitations 02/17/2017    Past Surgical History:  Procedure Laterality Date  . CESAREAN SECTION    . TUBAL LIGATION      Social History   Tobacco Use  . Smoking status: Never Smoker  . Smokeless tobacco: Never Used  Substance Use Topics  . Alcohol use:  No    Family History  Problem Relation Age of Onset  . COPD Mother   . Hypertension Father   . Heart failure Maternal Grandmother     Review of Systems  Constitutional: Positive for weight loss. Negative for chills, fever and malaise/fatigue.  Respiratory: Negative for cough and shortness of breath.   Cardiovascular: Negative for chest pain, palpitations and leg swelling.  Gastrointestinal: Negative for abdominal pain, nausea and vomiting.  Musculoskeletal: Positive for back pain and joint pain.  Psychiatric/Behavioral: Negative for depression. The patient is nervous/anxious. The patient does not have insomnia.    Per hpi  OBJECTIVE:  Blood pressure 102/84, pulse 100, temperature 98.5 F (36.9 C), temperature source Oral, height 5\' 2"  (1.575 m), weight 114 lb 6.4 oz (51.9 kg), SpO2 100 %.  Physical Exam  Constitutional: She is oriented to person, place, and time. She appears well-developed and well-nourished.  HENT:  Head: Normocephalic and atraumatic.  Mouth/Throat: Oropharynx is clear and moist. No oropharyngeal exudate.  Eyes: Pupils are equal, round, and reactive to light. EOM are normal. No scleral icterus.  Neck: Neck supple.  Cardiovascular: Normal rate, regular rhythm and normal heart sounds. Exam reveals no gallop and no friction rub.  No murmur heard. Pulmonary/Chest: Effort normal and breath sounds normal. She has no wheezes. She has no rales.  Musculoskeletal: She exhibits no edema.       Thoracic back: She exhibits tenderness and spasm. She exhibits no bony tenderness.  Neurological: She is alert and oriented to person, place, and time.  Skin: Skin is warm and dry.  Psychiatric: She has a normal mood and affect.  Nursing note and vitals reviewed.      Results for orders placed or performed in visit on 09/15/17 (from the past 24 hour(s))  POCT urinalysis dipstick     Status: Abnormal   Collection Time: 09/15/17 12:10 PM  Result Value Ref Range   Color, UA  yellow yellow   Clarity, UA clear clear   Glucose, UA negative negative mg/dL   Bilirubin, UA small (A) negative   Ketones, POC UA moderate (40) (A) negative mg/dL   Spec Grav, UA >=1.030 (A) 1.010 - 1.025   Blood, UA negative negative   pH, UA 5.0 5.0 - 8.0   Protein Ur, POC negative negative mg/dL   Urobilinogen, UA 0.2 0.2 or 1.0 E.U./dL   Nitrite, UA Negative Negative   Leukocytes, UA Negative Negative    ASSESSMENT and PLAN  1. Attention deficit disorder (ADD) without hyperactivity Improved. However not controlled. Increasing vyvanse to 40mg  once a day. Consider changing to a different formulation vs referral. Continue to monitor weight, discussed nutational supplements.     2. Urine frequency U/A suggestive of dehydration, discussed pushing fluids. Sending  for culture to ensure no infection.  - POCT urinalysis dipstick - Urine Culture  3. Greater trochanteric bursitis of left hip 4. Upper back pain on left side Changing to ibuprofen, discussed supportive measures. Provided exercises to do at home for bursitis.  Other orders - lisdexamfetamine (VYVANSE) 40 MG capsule; Take 1 capsule (40 mg total) by mouth daily. - ibuprofen (ADVIL,MOTRIN) 600 MG tablet; Take 1 tablet (600 mg total) by mouth every 8 (eight) hours as needed.  Return in about 3 months (around 12/16/2017).    Rutherford Guys, MD Primary Care at [place], [place] [address] Ph.  [phone] Fax [phone]

## 2017-09-17 ENCOUNTER — Telehealth: Payer: Self-pay

## 2017-09-17 LAB — URINE CULTURE

## 2017-09-17 MED ORDER — NITROFURANTOIN MONOHYD MACRO 100 MG PO CAPS: 100.0000 mg | ORAL_CAPSULE | Freq: Two times a day (BID) | ORAL | 0 refills | Status: DC

## 2017-09-17 NOTE — Telephone Encounter (Signed)
Pt. Given urine culture results and instructions. Verbalizes understanding.

## 2017-09-17 NOTE — Addendum Note (Signed)
Addended by: Rutherford Guys on: 09/17/2017 09:24 AM   Modules accepted: Orders

## 2017-09-29 ENCOUNTER — Encounter: Payer: Self-pay | Admitting: Family Medicine

## 2017-09-29 ENCOUNTER — Other Ambulatory Visit: Payer: Self-pay

## 2017-09-29 ENCOUNTER — Ambulatory Visit: Payer: BLUE CROSS/BLUE SHIELD | Admitting: Family Medicine

## 2017-09-29 VITALS — BP 116/79 | HR 114 | Temp 98.1°F | Resp 17 | Ht 62.0 in | Wt 111.0 lb

## 2017-09-29 DIAGNOSIS — R1013 Epigastric pain: Secondary | ICD-10-CM

## 2017-09-29 DIAGNOSIS — R110 Nausea: Secondary | ICD-10-CM | POA: Diagnosis not present

## 2017-09-29 DIAGNOSIS — R945 Abnormal results of liver function studies: Secondary | ICD-10-CM | POA: Diagnosis not present

## 2017-09-29 DIAGNOSIS — R7989 Other specified abnormal findings of blood chemistry: Secondary | ICD-10-CM

## 2017-09-29 LAB — POCT URINALYSIS DIP (MANUAL ENTRY)
Blood, UA: NEGATIVE
Glucose, UA: NEGATIVE mg/dL
Leukocytes, UA: NEGATIVE
Nitrite, UA: NEGATIVE
Spec Grav, UA: 1.02 (ref 1.010–1.025)
Urobilinogen, UA: 1 E.U./dL
pH, UA: 5.5 (ref 5.0–8.0)

## 2017-09-29 MED ORDER — OMEPRAZOLE 40 MG PO CPDR: 40.0000 mg | DELAYED_RELEASE_CAPSULE | Freq: Every day | ORAL | 0 refills | Status: DC

## 2017-09-29 NOTE — Progress Notes (Signed)
7/22/20193:04 PM  Colleen Mccullough [birth year], 47 y.o. female [id number]  Chief Complaint  Patient presents with  . abdominal pain x 2-3 weeks    daughter tested positive for H. pylori, and pt says she is nauseated, fatigued, dehydration and unable to eat, prior emesis and fever but none now.  Some chills, per pt living on gingerale and water.  Seen at [place] and told she could ? be dehydrated.  Nauseated feeling x 2-3 weeks    HPI:   Patient is a 47 y.o. female with past medical history significant for ADD who presents today for abd pain for past several weeks Pain constant epigastric region, not made better or worse by food She has been very nauseous, poor appetite.  She was vomiting towards beginning of symptoms but that has now resolved She also felt feverish towards the beginning but that also has resolved, though she continues to have chills and night sweats.  She denies any diarrhea, constipation, melena or hematochezia She denies any hematemesis She reports daughter has been treated for h pylori twice and had similar symptoms, requesting to be tested  Fall Risk  09/29/2017 09/15/2017 08/07/2017 08/01/2017 02/13/2017  Falls in the past year? No No No No No     Depression screen [facility] 2/9 09/29/2017 09/15/2017 08/07/2017  Decreased Interest 0 0 0  Down, Depressed, Hopeless 1 0 0  PHQ - 2 Score 1 0 0  Altered sleeping - - -  Tired, decreased energy - - -  Change in appetite - - -  Feeling bad or failure about yourself  - - -  Trouble concentrating - - -  Moving slowly or fidgety/restless - - -  Suicidal thoughts - - -  PHQ-9 Score - - -  Difficult doing work/chores - - -    Allergies  Allergen Reactions  . Amoxicillin Other (See Comments)    White splotches Has patient had a PCN reaction causing immediate rash, facial/tongue/throat swelling, SOB or lightheadedness with hypotension: unknown Has patient had a PCN reaction causing severe rash involving mucus membranes or  skin necrosis: unknown Has patient had a PCN reaction that required hospitalization : drs office Did PCN reaction occurring within the last 10 years: No, childhood allergy If all of the above answers are "NO", then may proceed with Cephalosporin use.   Carlton Adam [Propoxyphene N-Acetaminophen] Nausea And Vomiting  . Latex Other (See Comments)    Irritates on the inside  . Percocet [Oxycodone-Acetaminophen] Nausea And Vomiting  . Promethazine Hcl Other (See Comments)    seizures    Prior to Admission medications   Medication Sig Start Date End Date Taking? Authorizing Provider  clonazePAM (KLONOPIN) 1 MG tablet Take 1 tablet (1 mg total) by mouth at bedtime as needed for anxiety. 03/24/17  Yes Shawnee Knapp, MD  ibuprofen (ADVIL,MOTRIN) 600 MG tablet Take 1 tablet (600 mg total) by mouth every 8 (eight) hours as needed. 09/15/17  Yes Rutherford Guys, MD  lisdexamfetamine (VYVANSE) 40 MG capsule Take 1 capsule (40 mg total) by mouth daily. 09/15/17  Yes Rutherford Guys, MD  nitrofurantoin, macrocrystal-monohydrate, (MACROBID) 100 MG capsule Take 1 capsule (100 mg total) by mouth 2 (two) times daily. Patient not taking: Reported on 09/29/2017 09/17/17   Rutherford Guys, MD    Past Medical History:  Diagnosis Date  . ADD (attention deficit disorder)   . Asthma   . Atypical chest pain 02/17/2017  . Bipolar 1 disorder ([place])   .  Cancer ([place])   . Depression   . Hyperglycemia   . Hypoglycemia   . Migraines   . Palpitations 02/17/2017    Past Surgical History:  Procedure Laterality Date  . CESAREAN SECTION    . TUBAL LIGATION      Social History   Tobacco Use  . Smoking status: Never Smoker  . Smokeless tobacco: Never Used  Substance Use Topics  . Alcohol use: No    Family History  Problem Relation Age of Onset  . COPD Mother   . Hypertension Father   . Heart failure Maternal Grandmother     ROS Per hpi  OBJECTIVE:  Blood pressure 116/79, pulse (!) 114, temperature 98.1  F (36.7 C), temperature source Oral, resp. rate 17, height 5\' 2"  (1.575 m), weight 111 lb (50.3 kg), last menstrual period 09/23/2017, SpO2 99 %. Body mass index is 20.3 kg/m.   Wt Readings from Last 3 Encounters:  09/29/17 111 lb (50.3 kg)  09/15/17 114 lb 6.4 oz (51.9 kg)  08/07/17 125 lb 6.4 oz (56.9 kg)    Physical Exam  Constitutional: She is oriented to person, place, and time. She appears well-developed and well-nourished.  HENT:  Head: Normocephalic and atraumatic.  Mouth/Throat: Oropharynx is clear and moist and mucous membranes are normal. Mucous membranes are not dry. No oropharyngeal exudate.  Eyes: Pupils are equal, round, and reactive to light. Conjunctivae and EOM are normal. No scleral icterus.  Neck: Neck supple.  Cardiovascular: Normal rate, regular rhythm and normal heart sounds. Exam reveals no gallop and no friction rub.  No murmur heard. Pulmonary/Chest: Effort normal and breath sounds normal. She has no wheezes. She has no rales.  Abdominal: Soft. Bowel sounds are normal. She exhibits no distension and no mass. There is no hepatosplenomegaly. There is tenderness in the epigastric area and left upper quadrant. There is no rebound and no guarding. No hernia.  Musculoskeletal: She exhibits no edema.  Neurological: She is alert and oriented to person, place, and time.  Skin: Skin is warm and dry. Capillary refill takes 2 to 3 seconds.  Psychiatric: She has a normal mood and affect.  Nursing note and vitals reviewed.    Results for orders placed or performed in visit on 09/29/17 (from the past 24 hour(s))  POCT urinalysis dipstick     Status: Abnormal   Collection Time: 09/29/17  2:52 PM  Result Value Ref Range   Color, UA yellow yellow   Clarity, UA clear clear   Glucose, UA negative negative mg/dL   Bilirubin, UA moderate (A) negative   Ketones, POC UA moderate (40) (A) negative mg/dL   Spec Grav, UA 1.020 1.010 - 1.025   Blood, UA negative negative   pH,  UA 5.5 5.0 - 8.0   Protein Ur, POC trace (A) negative mg/dL   Urobilinogen, UA 1.0 0.2 or 1.0 E.U./dL   Nitrite, UA Negative Negative   Leukocytes, UA Negative Negative    ASSESSMENT and PLAN  1. Epigastric pain Starting omeprazole, testing per labs below. Continue to push fluids. RTC precautions given.  - POCT urinalysis dipstick - CBC - Comprehensive metabolic panel - H. pylori breath test - Lipase  2. Nausea without vomiting - CBC - Comprehensive metabolic panel - H. pylori breath test - Lipase  3. Abnormal LFTs - Acute Hep Panel & Hep B Surface Ab; Future - Comprehensive metabolic panel; Future  Other orders - omeprazole (PRILOSEC) 40 MG capsule; Take 1 capsule (40 mg total) by mouth  daily. - H. pylori Breath Collection - bismuth subsalicylate (PEPTO-BISMOL) 262 MG/15ML suspension; Take 30 mLs by mouth every 6 (six) hours as needed for up to 14 days. - metroNIDAZOLE (FLAGYL) 250 MG tablet; Take 1 tablet (250 mg total) by mouth 4 (four) times daily for 14 days.    Return in about 2 weeks (around 10/13/2017).    Rutherford Guys, MD Primary Care at [place] [place], [place] [address] Ph.  [phone] Fax [phone]

## 2017-09-30 LAB — CBC
Hematocrit: 42.5 % (ref 34.0–46.6)
Hemoglobin: 14.1 g/dL (ref 11.1–15.9)
MCH: 28.7 pg (ref 26.6–33.0)
MCHC: 33.2 g/dL (ref 31.5–35.7)
MCV: 87 fL (ref 79–97)
Platelets: 228 10*3/uL (ref 150–450)
RBC: 4.91 x10E6/uL (ref 3.77–5.28)
RDW: 13.7 % (ref 12.3–15.4)
WBC: 5.8 10*3/uL (ref 3.4–10.8)

## 2017-09-30 LAB — COMPREHENSIVE METABOLIC PANEL
ALT: 365 IU/L — ABNORMAL HIGH (ref 0–32)
AST: 98 IU/L — ABNORMAL HIGH (ref 0–40)
Albumin/Globulin Ratio: 1.8 (ref 1.2–2.2)
Albumin: 4.5 g/dL (ref 3.5–5.5)
Alkaline Phosphatase: 253 IU/L — ABNORMAL HIGH (ref 39–117)
BUN/Creatinine Ratio: 18 (ref 9–23)
BUN: 13 mg/dL (ref 6–24)
Bilirubin Total: 0.5 mg/dL (ref 0.0–1.2)
CO2: 24 mmol/L (ref 20–29)
Calcium: 9.4 mg/dL (ref 8.7–10.2)
Chloride: 101 mmol/L (ref 96–106)
Creatinine, Ser: 0.74 mg/dL (ref 0.57–1.00)
GFR calc Af Amer: 112 mL/min/{1.73_m2} (ref >59)
GFR calc non Af Amer: 97 mL/min/{1.73_m2} (ref >59)
Globulin, Total: 2.5 g/dL (ref 1.5–4.5)
Glucose: 100 mg/dL — ABNORMAL HIGH (ref 65–99)
Potassium: 4.3 mmol/L (ref 3.5–5.2)
Sodium: 140 mmol/L (ref 134–144)
Total Protein: 7 g/dL (ref 6.0–8.5)

## 2017-09-30 LAB — H. PYLORI BREATH TEST: H pylori Breath Test: POSITIVE — AB

## 2017-09-30 LAB — H. PYLORI BREATH COLLECTION

## 2017-09-30 LAB — LIPASE: Lipase: 22 U/L (ref 14–72)

## 2017-10-06 ENCOUNTER — Encounter: Payer: Self-pay | Admitting: Family Medicine

## 2017-10-06 ENCOUNTER — Telehealth: Payer: Self-pay | Admitting: Family Medicine

## 2017-10-06 NOTE — Telephone Encounter (Signed)
Copied from [place] [id number]. Topic: General - Other >> Oct 06, 2017 10:24 AM Yvette Rack wrote: Reason for CRM: pt calling for H pylori results from 09-29-17 she states that she cant see it in mychart

## 2017-10-07 ENCOUNTER — Telehealth: Payer: Self-pay

## 2017-10-07 MED ORDER — BISMUTH SUBSALICYLATE 262 MG/15ML PO SUSP: 30.0000 mL | Freq: Four times a day (QID) | ORAL | 1 refills | Status: DC | PRN

## 2017-10-07 MED ORDER — METRONIDAZOLE 250 MG PO TABS: 250.0000 mg | ORAL_TABLET | Freq: Four times a day (QID) | ORAL | 0 refills | Status: DC

## 2017-10-07 MED ORDER — TETRACYCLINE HCL 500 MG PO CAPS: 500.0000 mg | ORAL_CAPSULE | Freq: Four times a day (QID) | ORAL | 0 refills | Status: DC

## 2017-10-07 NOTE — Telephone Encounter (Signed)
Medications sent

## 2017-10-07 NOTE — Telephone Encounter (Signed)
Pt found out she tested positive of H-Pylori via Mychart, needing medication asap.

## 2017-10-07 NOTE — Telephone Encounter (Signed)
Pt states she saw her H pylori is positive on mychart and would like a call to see what the doctors plan is. Please advise.

## 2017-10-08 ENCOUNTER — Encounter: Payer: Self-pay | Admitting: Family Medicine

## 2017-10-08 ENCOUNTER — Telehealth: Payer: Self-pay | Admitting: Family Medicine

## 2017-10-08 MED ORDER — METRONIDAZOLE 500 MG PO TABS: 500.0000 mg | ORAL_TABLET | Freq: Three times a day (TID) | ORAL | 0 refills | Status: AC

## 2017-10-08 MED ORDER — CLARITHROMYCIN 500 MG PO TABS: 500.0000 mg | ORAL_TABLET | Freq: Two times a day (BID) | ORAL | 0 refills | Status: DC

## 2017-10-08 MED ORDER — LISDEXAMFETAMINE DIMESYLATE 40 MG PO CAPS: 40.0000 mg | ORAL_CAPSULE | Freq: Every day | ORAL | 0 refills | Status: DC

## 2017-10-08 NOTE — Telephone Encounter (Signed)
Pt is requesting a less expensive medication to replace tetracycline 500mg 

## 2017-10-08 NOTE — Telephone Encounter (Unsigned)
Copied from [place] [id number]. Topic: General - Other >> Oct 08, 2017  2:33 PM [name] wrote: Reason for CRM: patient is calling to see if there is a less expensive medication for tetracycline (ACHROMYCIN,SUMYCIN) 500 MG capsule that will be covered under her BCBS. Patient stated she picked up the other medication. She is requesting a call back. Please advise

## 2017-10-08 NOTE — Telephone Encounter (Signed)
Copied from [place] [id number]. Topic: General - Other >> Oct 08, 2017  2:33 PM [name] wrote: Reason for CRM: patient is calling to see if there is a less expensive medication for tetracycline (ACHROMYCIN,SUMYCIN) 500 MG capsule that will be covered under her BCBS. Patient stated she picked up the other medication. She is requesting a call back. Please advise

## 2017-10-08 NOTE — Telephone Encounter (Signed)
Patient states she will be coming in for future labs tomorrow.   She has spoken with Doctor already

## 2017-10-09 ENCOUNTER — Telehealth: Payer: Self-pay

## 2017-10-09 ENCOUNTER — Telehealth: Payer: Self-pay | Admitting: Family Medicine

## 2017-10-09 ENCOUNTER — Ambulatory Visit: Payer: Self-pay | Admitting: *Deleted

## 2017-10-09 NOTE — Telephone Encounter (Signed)
Contacted pt regarding information; she states that she is not used to taking that many pills at one time, and she is having nausea due to this; also she says that her daughter had taken clarithromycin, and said that it was nasty and made her gag; the pt would like to know if she can get a different flavor of the medication; attempted to verify if swallowing a pill of the pills make her nauseous/gag, or if the medication itself is making her nauseous; conference call initiated with Lexine Baton at [facility]; Lexine Baton explains that the medication should be taken with food but she will have to send a message to Dr Pamella Pert; Lexine Baton also encouraged the pt to break the pills up in order to help the pt tolerate the pills better; the pt also states that her boyfriend is positive for h pylori, and she wants to know if she can be around him? Nikki explained no exchanging of body fluids and utensils. Lexine Baton will call the pt the morning of 10/10/17 at [phone]; the pt states that a detailed message on her voicemail; will route to office for notification of this encounter.

## 2017-10-09 NOTE — Telephone Encounter (Signed)
Copied from [place] [phone]. Topic: Quick Communication - See Telephone Encounter >> Oct 09, 2017  9:04 AM Marja Kays F wrote: Pt is wanting to know if h. Pylori is contagious if she has been on meds starting yesterday and her boyfriend has been diagnosed with it but has not started on meds and she will be around him this weekend and would like to  Know what to do -also the meds baxin and metronidasole are hard to swallow and would like to know if it is possible to get these in liquid form  Best number [phone]

## 2017-10-09 NOTE — Telephone Encounter (Signed)
see telephone encounter 10/09/17 at 1502

## 2017-10-09 NOTE — Telephone Encounter (Signed)
Pt called regarding the medication she's taking for h-pylori. Wants to know is there an oral suspension of the medication. The pill form is very displeasing to the taste and causes upset stomach and nausea. Also, daughter and boy friend are + for the same bacteria. The Dr. has already informed the pt on what she should and should not do with having this bacteria. She still is asking for clarification. I informed her that I will call her in the morning once I speak to the provider.

## 2017-10-09 NOTE — Telephone Encounter (Addendum)
Spoke with Colleen Mccullough at [facility] and she states that the information has been routed to the provider; the provider is not in the office, and will have to call the patient back tomorrow 10/10/17; will notify pt.

## 2017-10-09 NOTE — Telephone Encounter (Deleted)
Relation to pt: self Call back number:[phone]   Reason for call:  Patient checking on the status of message below, please advise

## 2017-10-10 ENCOUNTER — Other Ambulatory Visit: Payer: Self-pay

## 2017-10-10 ENCOUNTER — Ambulatory Visit: Payer: BLUE CROSS/BLUE SHIELD

## 2017-10-10 ENCOUNTER — Ambulatory Visit (INDEPENDENT_AMBULATORY_CARE_PROVIDER_SITE_OTHER): Payer: BLUE CROSS/BLUE SHIELD | Admitting: Family Medicine

## 2017-10-10 ENCOUNTER — Encounter: Payer: Self-pay | Admitting: Family Medicine

## 2017-10-10 VITALS — BP 119/76 | HR 87 | Temp 98.6°F | Ht 62.0 in | Wt 112.0 lb

## 2017-10-10 DIAGNOSIS — R112 Nausea with vomiting, unspecified: Secondary | ICD-10-CM | POA: Diagnosis not present

## 2017-10-10 DIAGNOSIS — A048 Other specified bacterial intestinal infections: Secondary | ICD-10-CM

## 2017-10-10 DIAGNOSIS — R945 Abnormal results of liver function studies: Secondary | ICD-10-CM | POA: Diagnosis not present

## 2017-10-10 DIAGNOSIS — R7989 Other specified abnormal findings of blood chemistry: Secondary | ICD-10-CM

## 2017-10-10 DIAGNOSIS — E860 Dehydration: Secondary | ICD-10-CM

## 2017-10-10 DIAGNOSIS — R1310 Dysphagia, unspecified: Secondary | ICD-10-CM

## 2017-10-10 MED ORDER — ONDANSETRON HCL 4 MG/2ML IJ SOLN: 2.0000 mg | Freq: Once | INTRAMUSCULAR | Status: DC

## 2017-10-10 MED ORDER — CHLORPROMAZINE HCL 10 MG PO TABS: 10.0000 mg | ORAL_TABLET | Freq: Three times a day (TID) | ORAL | 0 refills | Status: DC

## 2017-10-10 MED ORDER — SODIUM CHLORIDE 0.9 % IV SOLN: Freq: Once | INTRAVENOUS | Status: DC

## 2017-10-10 MED ORDER — ONDANSETRON HCL 4 MG/2ML IJ SOLN
2.0000 mg | Freq: Once | INTRAMUSCULAR | Status: AC
  Administered 2017-10-10: 2 mg via INTRAMUSCULAR

## 2017-10-10 NOTE — Progress Notes (Signed)
8/2/20194:15 PM  Colleen Mccullough [birth year], 47 y.o. female [id number]  Chief Complaint  Patient presents with  . Nausea    diarrhea and having mediction reaction with the H-pylori medication    HPI:   Patient is a 47 y.o. female with past medical history significant for recent diagnosis of h pylori infection who presents today for vomiting since she started antibiotics  Has not taken any antibiotics today She has been vomiting for the past 24 hours Able to take in very little PO + abd pain - sore, diffuse. Epigastric pain was getting better with PPI until abx were started. She continues with diarrhea - hematemesis, jaundice, melena or hematochezia, fever, chills, CP, palpitations or SOB  phenegran causes seizures zofran never works  Has not been able to see speech pathology for swallow study given dysphagia  Fall Risk  10/10/2017 09/29/2017 09/15/2017 08/07/2017 08/01/2017  Falls in the past year? No No No No No     Depression screen [facility] 2/9 10/10/2017 09/29/2017 09/15/2017  Decreased Interest 0 0 0  Down, Depressed, Hopeless 0 1 0  PHQ - 2 Score 0 1 0  Altered sleeping - - -  Tired, decreased energy - - -  Change in appetite - - -  Feeling bad or failure about yourself  - - -  Trouble concentrating - - -  Moving slowly or fidgety/restless - - -  Suicidal thoughts - - -  PHQ-9 Score - - -  Difficult doing work/chores - - -    Allergies  Allergen Reactions  . Amoxicillin Other (See Comments)    White splotches Has patient had a PCN reaction causing immediate rash, facial/tongue/throat swelling, SOB or lightheadedness with hypotension: unknown Has patient had a PCN reaction causing severe rash involving mucus membranes or skin necrosis: unknown Has patient had a PCN reaction that required hospitalization : drs office Did PCN reaction occurring within the last 10 years: No, childhood allergy If all of the above answers are "NO", then may proceed with Cephalosporin use.   Carlton Adam [Propoxyphene N-Acetaminophen] Nausea And Vomiting  . Latex Other (See Comments)    Irritates on the inside  . Percocet [Oxycodone-Acetaminophen] Nausea And Vomiting  . Promethazine Hcl Other (See Comments)    seizures    Prior to Admission medications   Medication Sig Start Date End Date Taking? Authorizing Provider  bismuth subsalicylate (PEPTO-BISMOL) 262 MG/15ML suspension Take 30 mLs by mouth every 6 (six) hours as needed for up to 14 days. 10/07/17 10/21/17 Yes Rutherford Guys, MD  clarithromycin (BIAXIN) 500 MG tablet Take 1 tablet (500 mg total) by mouth 2 (two) times daily for 14 days. 10/08/17 10/22/17 Yes Rutherford Guys, MD  clonazePAM (KLONOPIN) 1 MG tablet Take 1 tablet (1 mg total) by mouth at bedtime as needed for anxiety. 03/24/17  Yes Shawnee Knapp, MD  ibuprofen (ADVIL,MOTRIN) 600 MG tablet Take 1 tablet (600 mg total) by mouth every 8 (eight) hours as needed. 09/15/17  Yes Rutherford Guys, MD  lisdexamfetamine (VYVANSE) 40 MG capsule Take 1 capsule (40 mg total) by mouth daily. 10/08/17  Yes Rutherford Guys, MD  metroNIDAZOLE (FLAGYL) 250 MG tablet Take 1 tablet (250 mg total) by mouth 4 (four) times daily for 14 days. 10/07/17 10/21/17 Yes Rutherford Guys, MD  metroNIDAZOLE (FLAGYL) 500 MG tablet Take 1 tablet (500 mg total) by mouth 3 (three) times daily for 5 days. 10/08/17 10/13/17 Yes Rutherford Guys, MD  omeprazole [facility])  40 MG capsule Take 1 capsule (40 mg total) by mouth daily. 09/29/17  Yes Rutherford Guys, MD    Past Medical History:  Diagnosis Date  . ADD (attention deficit disorder)   . Asthma   . Atypical chest pain 02/17/2017  . Bipolar 1 disorder ([place])   . Cancer ([place])   . Depression   . Hyperglycemia   . Hypoglycemia   . Migraines   . Palpitations 02/17/2017    Past Surgical History:  Procedure Laterality Date  . CESAREAN SECTION    . TUBAL LIGATION      Social History   Tobacco Use  . Smoking status: Never Smoker  . Smokeless  tobacco: Never Used  Substance Use Topics  . Alcohol use: No    Family History  Problem Relation Age of Onset  . COPD Mother   . Hypertension Father   . Heart failure Maternal Grandmother     ROS Per hpi  OBJECTIVE:  Blood pressure 119/76, pulse 87, temperature 98.6 F (37 C), temperature source Oral, height 5\' 2"  (1.575 m), weight 112 lb (50.8 kg), last menstrual period 09/23/2017, SpO2 98 %. Body mass index is 20.49 kg/m.   Physical Exam  Constitutional: She is oriented to person, place, and time. She appears well-developed and well-nourished. She has a sickly appearance.  HENT:  Head: Normocephalic and atraumatic.  Mouth/Throat: Oropharynx is clear and moist. No oropharyngeal exudate.  Eyes: Pupils are equal, round, and reactive to light. Conjunctivae and EOM are normal. No scleral icterus.  Neck: Neck supple.  Cardiovascular: Normal rate, regular rhythm and normal heart sounds. Exam reveals no gallop and no friction rub.  No murmur heard. Pulmonary/Chest: Effort normal and breath sounds normal. She has no wheezes. She has no rales.  Abdominal: Soft. She exhibits no distension and no mass. Bowel sounds are decreased. There is no hepatosplenomegaly. There is generalized tenderness. There is no rebound and no guarding.  Musculoskeletal: She exhibits no edema.  Neurological: She is alert and oriented to person, place, and time.  Skin: Skin is warm and dry.  Nursing note and vitals reviewed.   Patient felt mod better after IV fluids and antiemetic. Able to tolerate PO challenge, had UOP.  ASSESSMENT and PLAN  1. Dehydration - 0.9 %  sodium chloride infusion  2. Nausea and vomiting, intractability of vomiting not specified, unspecified vomiting type - ondansetron (ZOFRAN) injection 2 mg - 0.9 %  sodium chloride infusion  3. H. pylori infection Patient having sign difficulties with abx regime. Discussed pushing fluids, spacing medication, taking with food, taking  probiotics, prescribing nausea meds for home. RTC precautions given.  4. Abnormal LFTs Unclear etiology will repeat and check for acute hep given current presentation. - Comprehensive metabolic panel - Acute Hep Panel & Hep B Surface Ab  5. Dysphagia, unspecified type followup with swallow once better  Other orders - chlorproMAZINE (THORAZINE) 10 MG tablet; Take 1 tablet (10 mg total) by mouth 3 (three) times daily.    Return if symptoms worsen or fail to improve.    Rutherford Guys, MD Primary Care at [place], [place] [address] Ph.  [phone] Fax [phone]

## 2017-10-10 NOTE — Patient Instructions (Signed)
     IF you received an x-ray today, you will receive an invoice from [facility]. Please contact [facility] at [phone] with questions or concerns regarding your invoice.   IF you received labwork today, you will receive an invoice from [facility]. Please contact [facility] at [phone] with questions or concerns regarding your invoice.   Our billing staff will not be able to assist you with questions regarding bills from these companies.  You will be contacted with the lab results as soon as they are available. The fastest way to get your results is to activate your My Chart account. Instructions are located on the last page of this paperwork. If you have not heard from us regarding the results in 2 weeks, please contact this office.     

## 2017-10-10 NOTE — Telephone Encounter (Signed)
Pt following up on on request for a call back about changing the med.

## 2017-10-10 NOTE — Telephone Encounter (Signed)
Seen today in clinic.

## 2017-10-11 ENCOUNTER — Encounter: Payer: Self-pay | Admitting: Family Medicine

## 2017-10-11 ENCOUNTER — Ambulatory Visit: Payer: BLUE CROSS/BLUE SHIELD | Admitting: Family Medicine

## 2017-10-11 LAB — COMPREHENSIVE METABOLIC PANEL
ALT: 25 IU/L (ref 0–32)
AST: 20 IU/L (ref 0–40)
Albumin/Globulin Ratio: 1.6 (ref 1.2–2.2)
Albumin: 3.6 g/dL (ref 3.5–5.5)
Alkaline Phosphatase: 104 IU/L (ref 39–117)
BUN/Creatinine Ratio: 14 (ref 9–23)
BUN: 9 mg/dL (ref 6–24)
Bilirubin Total: 0.5 mg/dL (ref 0.0–1.2)
CO2: 19 mmol/L — ABNORMAL LOW (ref 20–29)
Calcium: 8.5 mg/dL — ABNORMAL LOW (ref 8.7–10.2)
Chloride: 106 mmol/L (ref 96–106)
Creatinine, Ser: 0.65 mg/dL (ref 0.57–1.00)
GFR calc Af Amer: 122 mL/min/{1.73_m2} (ref >59)
GFR calc non Af Amer: 106 mL/min/{1.73_m2} (ref >59)
Globulin, Total: 2.2 g/dL (ref 1.5–4.5)
Glucose: 76 mg/dL (ref 65–99)
Potassium: 4.2 mmol/L (ref 3.5–5.2)
Sodium: 140 mmol/L (ref 134–144)
Total Protein: 5.8 g/dL — ABNORMAL LOW (ref 6.0–8.5)

## 2017-10-11 LAB — ACUTE HEP PANEL AND HEP B SURFACE AB
Hep A IgM: NEGATIVE
Hep B C IgM: NEGATIVE
Hep C Virus Ab: <0.1 s/co ratio (ref 0.0–0.9)
Hepatitis B Surf Ab Quant: <3.1 m[IU]/mL — ABNORMAL LOW (ref >9.9)
Hepatitis B Surface Ag: NEGATIVE

## 2017-10-12 ENCOUNTER — Encounter: Payer: Self-pay | Admitting: Family Medicine

## 2017-10-12 NOTE — Telephone Encounter (Signed)
Pt seen 5pm on 8/2

## 2017-10-14 ENCOUNTER — Ambulatory Visit: Payer: Self-pay | Admitting: Family Medicine

## 2017-10-14 ENCOUNTER — Ambulatory Visit: Payer: BLUE CROSS/BLUE SHIELD | Admitting: Family Medicine

## 2017-10-14 ENCOUNTER — Telehealth: Payer: Self-pay | Admitting: Family Medicine

## 2017-10-14 DIAGNOSIS — A048 Other specified bacterial intestinal infections: Secondary | ICD-10-CM

## 2017-10-14 DIAGNOSIS — T889XXD Complication of surgical and medical care, unspecified, subsequent encounter: Secondary | ICD-10-CM

## 2017-10-14 NOTE — Telephone Encounter (Signed)
Pt saw dr Pamella Pert on 8/2. Pt is concern she can not keep her medication down. Pt is still having nausea and now having lightheadedness. Patient tested positive for h pylori. Pt was dehydrate and was given fluid at her office visit on 8-2     Patient is having a hard time keeping medication down- she needs advisement on how to keep to down- she is not having success. Patient is at work today and she is very sick- she has to be able to work to pay her bills. Are there any alternatives for her? Best contact# [phone]  Reason for Disposition . [1] MILD or MODERATE vomiting AND [2] present > 48 hours (2 days) (Exception: mild vomiting with associated diarrhea)  Answer Assessment - Initial Assessment Questions 1. VOMITING SEVERITY: "How many times have you vomited in the past 24 hours?"     - MILD:  1 - 2 times/day    - MODERATE: 3 - 5 times/day, decreased oral intake without significant weight loss or symptoms of dehydration    - SEVERE: 6 or more times/day, vomits everything or nearly everything, with significant weight loss, symptoms of dehydration      2 times 2. ONSET: "When did the vomiting begin?"      When patient started antibiotic 3. FLUIDS: "What fluids or food have you vomited up today?" "Have you been able to keep any fluids down?"     Patient has tried several ways to take pills- she is afraid she is going to lose her job. She states the Pepto is not helping. Patient is keeping probiotics and electrolyte pop cycles and crackers.  4. ABDOMINAL PAIN: "Are your having any abdominal pain?" If yes : "How bad is it and what does it feel like?" (e.g., crampy, dull, intermittent, constant)      Patient feels like someone is constantly kicking her in the stomach 5. DIARRHEA: "Is there any diarrhea?" If so, ask: "How many times today?"      Still having diarrhea- 3 times today- trying to form 6. CONTACTS: "Is there anyone else in the family with the same symptoms?"      Daughter has been  sick- PCP is aware 7. CAUSE: "What do you think is causing your vomiting?"     Antibiotic is making her sick 8. HYDRATION STATUS: "Any signs of dehydration?" (e.g., dry mouth [not only dry lips], too weak to stand) "When did you last urinate?"     Weak- patient states she is concerned that she is going to get as bad as she was the last visit. 9. OTHER SYMPTOMS: "Do you have any other symptoms?" (e.g., fever, headache, vertigo, vomiting blood or coffee grounds, recent head injury)     no 10. PREGNANCY: "Is there any chance you are pregnant?" "When was your last menstrual period?"       n/a  Protocols used: [facility]

## 2017-10-14 NOTE — Telephone Encounter (Signed)
Call to [facility]- make office aware note being sent over on patient.

## 2017-10-14 NOTE — Telephone Encounter (Signed)
Pt called to check on advisement from pcp

## 2017-10-14 NOTE — Telephone Encounter (Signed)
Copied from [place] [id number]. Topic: Quick Communication - See Telephone Encounter >> Oct 14, 2017 11:41 AM Rutherford Nail, NT wrote: CRM for notification. See Telephone encounter for: 10/14/17. Patient calling in regards to the triage call from this morning. States that she is spoke with a nurse and was wanting to know if Dr Pamella Pert had said anything back. States that she was able to keep down the first of the medication, but since 11am she has not been able to keep the medication down. Please advise.

## 2017-10-15 ENCOUNTER — Telehealth: Payer: Self-pay | Admitting: Family Medicine

## 2017-10-15 NOTE — Telephone Encounter (Signed)
Patient last took ABX 48 hours ago.  Decrease in v/d since then with no vomiting in 24 hours and one episode of diarrhea.  Is currently able to drink gatorade and had a small amount of food.  Pt concerned that she was not able to take enough ABX to treat H Pylori.  Would like to know if something else should be prescribed or if she should be retested.  Interested in possible herbal remedies or probiotics.  Refused appt at this time but recommended she c/b to schedule if she has further concerns prior to a return call.

## 2017-10-15 NOTE — Addendum Note (Signed)
Addended by: Rutherford Guys on: 10/15/2017 06:49 PM   Modules accepted: Orders

## 2017-10-15 NOTE — Telephone Encounter (Signed)
This is unfortunately the only treatment that I know about. I am referring to GI to discuss further. thanks

## 2017-10-15 NOTE — Telephone Encounter (Unsigned)
Copied from [place] [id number]. Topic: Quick Communication - See Telephone Encounter >> Oct 14, 2017 11:41 AM Rutherford Nail, NT wrote: CRM for notification. See Telephone encounter for: 10/14/17. Patient calling in regards to the triage call from this morning. States that she is spoke with a nurse and was wanting to know if Dr Pamella Pert had said anything back. States that she was able to keep down the first of the medication, but since 11am she has not been able to keep the medication down. Please advise.

## 2017-10-16 ENCOUNTER — Encounter: Payer: Self-pay | Admitting: Family Medicine

## 2017-10-16 NOTE — Telephone Encounter (Signed)
Patient called and states that she would like to talk to Dr. Pamella Pert about what is going on with her. She states that she is scared to pass what she has back to her daughter Please call CB# [phone]. Please see Mychart message

## 2017-10-16 NOTE — Telephone Encounter (Signed)
Patient called to request a call back from nurse or doctor regarding her test for h pylori.  She has an upcoming appt. But would like to ask some questions because she is concerned that her child could become sick as well.  Please respond back at [phone].

## 2017-10-17 NOTE — Telephone Encounter (Signed)
Please help process her referral to GI sooner, does not need to be [place].. thanks

## 2017-10-19 ENCOUNTER — Encounter: Payer: Self-pay | Admitting: Family Medicine

## 2017-10-21 ENCOUNTER — Other Ambulatory Visit: Payer: Self-pay

## 2017-10-21 ENCOUNTER — Ambulatory Visit: Payer: BLUE CROSS/BLUE SHIELD | Admitting: Family Medicine

## 2017-10-21 ENCOUNTER — Encounter: Payer: Self-pay | Admitting: Family Medicine

## 2017-10-21 VITALS — BP 102/68 | HR 100 | Temp 98.6°F | Ht 62.0 in | Wt 111.6 lb

## 2017-10-21 DIAGNOSIS — A048 Other specified bacterial intestinal infections: Secondary | ICD-10-CM | POA: Diagnosis not present

## 2017-10-21 DIAGNOSIS — R112 Nausea with vomiting, unspecified: Secondary | ICD-10-CM | POA: Diagnosis not present

## 2017-10-21 DIAGNOSIS — T889XXD Complication of surgical and medical care, unspecified, subsequent encounter: Secondary | ICD-10-CM

## 2017-10-21 NOTE — Progress Notes (Signed)
8/13/20194:07 PM  Colleen Mccullough 1970/08/15, 47 y.o. female [id number]  Chief Complaint  Patient presents with  . Nausea    nausea with vomiting. Wants blood check to see if virus is still present. Cannot take oral medication due to reactions    HPI:   Patient is a 48 y.o. female with past medical history significant for ADD and recent h pylori infection who presents today for followup  She had horrible vomiting with treatment She may had gotten somewhere 5-7 days of treatment Vomiting has improved since she stopped abx Nausea sign better Has not been able to eat a full meal until yesterday But overall eating/drinking small meals at a time Last omeprazole about 9 days   Fall Risk  10/21/2017 10/10/2017 09/29/2017 09/15/2017 08/07/2017  Falls in the past year? No No No No No     Depression screen [facility] 2/9 10/21/2017 10/10/2017 09/29/2017  Decreased Interest 0 0 0  Down, Depressed, Hopeless 0 0 1  PHQ - 2 Score 0 0 1  Altered sleeping - - -  Tired, decreased energy - - -  Change in appetite - - -  Feeling bad or failure about yourself  - - -  Trouble concentrating - - -  Moving slowly or fidgety/restless - - -  Suicidal thoughts - - -  PHQ-9 Score - - -  Difficult doing work/chores - - -    Allergies  Allergen Reactions  . Amoxicillin Other (See Comments)    White splotches Has patient had a PCN reaction causing immediate rash, facial/tongue/throat swelling, SOB or lightheadedness with hypotension: unknown Has patient had a PCN reaction causing severe rash involving mucus membranes or skin necrosis: unknown Has patient had a PCN reaction that required hospitalization : drs office Did PCN reaction occurring within the last 10 years: No, childhood allergy If all of the above answers are "NO", then may proceed with Cephalosporin use.   Carlton Adam [Propoxyphene N-Acetaminophen] Nausea And Vomiting  . Latex Other (See Comments)    Irritates on the inside  . Percocet  [Oxycodone-Acetaminophen] Nausea And Vomiting  . Promethazine Hcl Other (See Comments)    seizures    Prior to Admission medications   Medication Sig Start Date End Date Taking? Authorizing Provider  bismuth subsalicylate (PEPTO-BISMOL) 262 MG/15ML suspension Take 30 mLs by mouth every 6 (six) hours as needed for up to 14 days. 10/07/17 10/21/17 Yes Rutherford Guys, MD  chlorproMAZINE (THORAZINE) 10 MG tablet Take 1 tablet (10 mg total) by mouth 3 (three) times daily. 10/10/17  Yes Rutherford Guys, MD  clarithromycin (BIAXIN) 500 MG tablet Take 1 tablet (500 mg total) by mouth 2 (two) times daily for 14 days. 10/08/17 10/22/17 Yes Rutherford Guys, MD  clonazePAM (KLONOPIN) 1 MG tablet Take 1 tablet (1 mg total) by mouth at bedtime as needed for anxiety. 03/24/17  Yes Shawnee Knapp, MD  ibuprofen (ADVIL,MOTRIN) 600 MG tablet Take 1 tablet (600 mg total) by mouth every 8 (eight) hours as needed. 09/15/17  Yes Rutherford Guys, MD  lisdexamfetamine (VYVANSE) 40 MG capsule Take 1 capsule (40 mg total) by mouth daily. 10/08/17  Yes Rutherford Guys, MD  metroNIDAZOLE (FLAGYL) 250 MG tablet Take 1 tablet (250 mg total) by mouth 4 (four) times daily for 14 days. 10/07/17 10/21/17 Yes Rutherford Guys, MD  omeprazole (PRILOSEC) 40 MG capsule Take 1 capsule (40 mg total) by mouth daily. 09/29/17  Yes Rutherford Guys, MD  Past Medical History:  Diagnosis Date  . ADD (attention deficit disorder)   . Asthma   . Atypical chest pain 02/17/2017  . Bipolar 1 disorder ([place])   . Cancer ([place])   . Depression   . Hyperglycemia   . Hypoglycemia   . Migraines   . Palpitations 02/17/2017    Past Surgical History:  Procedure Laterality Date  . CESAREAN SECTION    . TUBAL LIGATION      Social History   Tobacco Use  . Smoking status: Never Smoker  . Smokeless tobacco: Never Used  Substance Use Topics  . Alcohol use: No    Family History  Problem Relation Age of Onset  . COPD Mother   . Hypertension  Father   . Heart failure Maternal Grandmother     ROS Per hpi  OBJECTIVE:  Blood pressure 102/68, pulse 100, temperature 98.6 F (37 C), temperature source Oral, height 5\' 2"  (1.575 m), weight 111 lb 9.6 oz (50.6 kg), last menstrual period 09/23/2017, SpO2 100 %. Body mass index is 20.41 kg/m.   Wt Readings from Last 3 Encounters:  10/21/17 111 lb 9.6 oz (50.6 kg)  10/10/17 112 lb (50.8 kg)  09/29/17 111 lb (50.3 kg)    Physical Exam  Constitutional: She is oriented to person, place, and time. She appears well-developed and well-nourished.  HENT:  Head: Normocephalic and atraumatic.  Mouth/Throat: Mucous membranes are normal.  Eyes: Pupils are equal, round, and reactive to light. EOM are normal. No scleral icterus.  Neck: Neck supple.  Pulmonary/Chest: Effort normal.  Neurological: She is alert and oriented to person, place, and time.  Skin: Skin is warm and dry.  Nursing note and vitals reviewed.   ASSESSMENT and PLAN  1. H. pylori infection Patient clinically much improved with stopping medications. She would like to be retested. Discussed too soon to retest given recent PPI use. Also discussed would not know what treatment to use given allergies/adverse reactions. Referral made to GI to further discuss options. Test ordered as future, patient will come back when she has been PPI and all other antiacids  - H. pylori breath test  2. Adverse effect of treatment, subsequent encounter - H. pylori breath test  3. Nausea and vomiting, intractability of vomiting not specified, unspecified vomiting type - H. pylori breath test    Return if symptoms worsen or fail to improve.    Rutherford Guys, MD Primary Care at [facility] [place],  [address] Ph.  [phone] Fax [phone]

## 2017-10-21 NOTE — Patient Instructions (Signed)
  I will contact you with your lab results within the next 2 weeks.  If you have not heard from us then please contact us. The fastest way to get your results is to register for My Chart.   IF you received an x-ray today, you will receive an invoice from [facility]. Please contact [facility] at [phone] with questions or concerns regarding your invoice.   IF you received labwork today, you will receive an invoice from [facility]. Please contact [facility] at [phone] with questions or concerns regarding your invoice.   Our billing staff will not be able to assist you with questions regarding bills from these companies.  You will be contacted with the lab results as soon as they are available. The fastest way to get your results is to activate your My Chart account. Instructions are located on the last page of this paperwork. If you have not heard from us regarding the results in 2 weeks, please contact this office.     

## 2017-10-22 ENCOUNTER — Encounter: Payer: Self-pay | Admitting: Family Medicine

## 2017-10-26 ENCOUNTER — Other Ambulatory Visit: Payer: Self-pay | Admitting: Family Medicine

## 2017-10-29 ENCOUNTER — Ambulatory Visit: Payer: BLUE CROSS/BLUE SHIELD | Admitting: Family Medicine

## 2017-10-30 ENCOUNTER — Telehealth: Payer: Self-pay | Admitting: Family Medicine

## 2017-10-30 NOTE — Telephone Encounter (Signed)
Copied from [place] [id number]. Topic: General - Other >> Oct 30, 2017  7:58 AM Scherrie Gerlach wrote: Reason for CRM: to Dr Pamella Pert: pt wants you to know she is very sorry for missing her appt yesterday, she got her days mixed up and thought it was tues all day. She has rescheduled for next week

## 2017-11-05 ENCOUNTER — Encounter: Payer: Self-pay | Admitting: Family Medicine

## 2017-11-05 ENCOUNTER — Other Ambulatory Visit: Payer: Self-pay

## 2017-11-05 ENCOUNTER — Ambulatory Visit: Payer: BLUE CROSS/BLUE SHIELD | Admitting: Family Medicine

## 2017-11-05 DIAGNOSIS — A048 Other specified bacterial intestinal infections: Secondary | ICD-10-CM

## 2017-11-05 DIAGNOSIS — R112 Nausea with vomiting, unspecified: Secondary | ICD-10-CM

## 2017-11-05 DIAGNOSIS — T889XXD Complication of surgical and medical care, unspecified, subsequent encounter: Secondary | ICD-10-CM

## 2017-11-05 NOTE — Addendum Note (Signed)
Addended by: Gari Crown D on: 11/05/2017 03:36 PM   Modules accepted: Orders

## 2017-11-05 NOTE — Patient Instructions (Signed)
° ° ° °  If you have lab work done today you will be contacted with your lab results within the next 2 weeks.  If you have not heard from us then please contact us. The fastest way to get your results is to register for My Chart. ° ° °IF you received an x-ray today, you will receive an invoice from [facility]. Please contact [facility] at [phone] with questions or concerns regarding your invoice.  ° °IF you received labwork today, you will receive an invoice from [facility]. Please contact [facility] at [phone] with questions or concerns regarding your invoice.  ° °Our billing staff will not be able to assist you with questions regarding bills from these companies. ° °You will be contacted with the lab results as soon as they are available. The fastest way to get your results is to activate your My Chart account. Instructions are located on the last page of this paperwork. If you have not heard from us regarding the results in 2 weeks, please contact this office. °  ° ° ° °

## 2017-11-05 NOTE — Progress Notes (Signed)
Lab visit only Patient not seen

## 2017-11-06 ENCOUNTER — Telehealth: Payer: Self-pay | Admitting: Family Medicine

## 2017-11-06 ENCOUNTER — Encounter: Payer: Self-pay | Admitting: Family Medicine

## 2017-11-06 DIAGNOSIS — F988 Other specified behavioral and emotional disorders with onset usually occurring in childhood and adolescence: Secondary | ICD-10-CM

## 2017-11-06 LAB — H. PYLORI BREATH COLLECTION

## 2017-11-06 LAB — H. PYLORI BREATH TEST: H pylori Breath Test: POSITIVE — AB

## 2017-11-06 MED ORDER — LISDEXAMFETAMINE DIMESYLATE 50 MG PO CAPS: 50.0000 mg | ORAL_CAPSULE | Freq: Every day | ORAL | 0 refills | Status: DC

## 2017-11-06 NOTE — Telephone Encounter (Signed)
Copied from [place]. Topic: Quick Communication - See Telephone Encounter >> Nov 06, 2017  1:20 PM Conception Chancy, NT wrote: CRM for notification. See Telephone encounter for: 11/06/17.  Patient was in office on 11/05/17. She did not realize that she only had 3 pills left. She would like to get a increase on lisdexamfetamine (VYVANSE) 40 MG capsule as well as a refill. Please advise.  [id number] - [place], [address] [place] [address] Phone: [phone] Fax: [phone]

## 2017-11-07 ENCOUNTER — Telehealth: Payer: Self-pay | Admitting: Family Medicine

## 2017-11-07 NOTE — Telephone Encounter (Signed)
Copied from [place] [id number]. Topic: Inquiry >> Nov 07, 2017  2:12 PM Conception Chancy, NT wrote: Reason for CRM: patient is calling and states she reviewed her labs on mychart H pylori is still positive in her system and she would like to know her next steps.

## 2017-11-12 NOTE — Telephone Encounter (Signed)
Prescription called in 8/29

## 2017-11-13 NOTE — Telephone Encounter (Signed)
Please advise. Dgaddy, CMA 

## 2017-11-17 NOTE — Telephone Encounter (Signed)
I sent this message to lab pool...she should have been called...anyway please communicate following message. thanks  Notes recorded by Rutherford Guys, MD on 11/07/2017 at 2:05 PM EDT Please call patient and let her know that her h pylori came back positive.  Referral has been made to Gastroenterology to [facility], if she has not heard from them then she should speak with referrals.  Thanks

## 2017-11-19 ENCOUNTER — Encounter: Payer: Self-pay | Admitting: Family Medicine

## 2017-11-20 MED ORDER — OMEPRAZOLE 40 MG PO CPDR: 40.0000 mg | DELAYED_RELEASE_CAPSULE | Freq: Every day | ORAL | 3 refills | Status: DC

## 2017-11-25 NOTE — Telephone Encounter (Signed)
Please advise patient a referral was sent to [facility] [phone] , she will need to call and schedule per not in computer they have left a message for her to call there office to [place].

## 2018-04-11 ENCOUNTER — Encounter: Payer: Self-pay | Admitting: Family Medicine

## 2018-04-13 NOTE — Telephone Encounter (Signed)
Copied from [place] [id number]. Topic: Quick Communication - See Telephone Encounter >> Apr 13, 2018  9:35 AM Rutherford Nail, NT wrote: CRM for notification. See Telephone encounter for: 04/13/18. Patient calling to check the status of getting a response back from her MyChart message from 04/11/2018. Please advise.

## 2018-05-28 ENCOUNTER — Encounter

## 2018-05-29 ENCOUNTER — Ambulatory Visit: Payer: BLUE CROSS/BLUE SHIELD | Admitting: Family Medicine

## 2018-06-18 ENCOUNTER — Telehealth (INDEPENDENT_AMBULATORY_CARE_PROVIDER_SITE_OTHER): Payer: BLUE CROSS/BLUE SHIELD | Admitting: Family Medicine

## 2018-06-18 ENCOUNTER — Other Ambulatory Visit: Payer: Self-pay

## 2018-06-18 DIAGNOSIS — F988 Other specified behavioral and emotional disorders with onset usually occurring in childhood and adolescence: Secondary | ICD-10-CM

## 2018-06-18 MED ORDER — AMPHETAMINE-DEXTROAMPHETAMINE 10 MG PO TABS: 10.0000 mg | ORAL_TABLET | Freq: Two times a day (BID) | ORAL | 0 refills | Status: DC

## 2018-06-18 NOTE — Progress Notes (Signed)
Chief complaint : Pt would like something in place of her vyvanse due to price. PHQ 9 score 11 and GAD 7 score 14  1.  Feeling nervous, anxious, or on edge  2 2.  Not being able to stop or control worrying  3 3.  Worrying too much about different things  3 4.  Trouble relaxing 2 5.  Being so restless that it's hard to sit still  1 6.  Becoming easily annoyed or irritable 2 7.  Feeling afraid as if something awful might happen  1

## 2018-06-18 NOTE — Progress Notes (Signed)
Virtual Visit via telephone Note  I connected with patient on 06/18/18 at 456pm by telephone and verified that I am speaking with the correct person using two identifiers. Colleen Mccullough is currently located at home and patient is currently with her during visit. The provider, Rutherford Guys, MD is located in their office at time of visit.  I discussed the limitations, risks, security and privacy concerns of performing an evaluation and management service by telephone and the availability of in person appointments. I also discussed with the patient that there may be a patient responsible charge related to this service. The patient expressed understanding and agreed to proceed.  CC: would like to restart medication  HPI Last OV Aug 2019 pmp reviewed Had changes to insurance Now lives an hour away Has become uber driver Completed treatment for h pylori - weight is back, appetite is normal.   Fall Risk  06/18/2018 06/18/2018 11/05/2017 11/05/2017 10/21/2017  Falls in the past year? 0 0 No No No  Number falls in past yr: 0 0 - - -  Injury with Fall? 0 0 - - -  Follow up Falls evaluation completed Falls evaluation completed - - -     Depression screen [facility] 2/9 06/18/2018 11/05/2017 11/05/2017  Decreased Interest 1 0 0  Down, Depressed, Hopeless 1 0 0  PHQ - 2 Score 2 0 0  Altered sleeping 1 - -  Tired, decreased energy 2 - -  Change in appetite 0 - -  Feeling bad or failure about yourself  3 - -  Trouble concentrating 3 - -  Moving slowly or fidgety/restless 0 - -  Suicidal thoughts 0 - -  PHQ-9 Score 11 - -  Difficult doing work/chores Somewhat difficult - -    Allergies  Allergen Reactions  . Amoxicillin Other (See Comments)    White splotches Has patient had a PCN reaction causing immediate rash, facial/tongue/throat swelling, SOB or lightheadedness with hypotension: unknown Has patient had a PCN reaction causing severe rash involving mucus membranes or skin necrosis: unknown  Has patient had a PCN reaction that required hospitalization : drs office Did PCN reaction occurring within the last 10 years: No, childhood allergy If all of the above answers are "NO", then may proceed with Cephalosporin use.   . Clarithromycin     Nausea and vomiting (taking as part of h pylori tx)  . Darvocet [Propoxyphene N-Acetaminophen] Nausea And Vomiting  . Latex Other (See Comments)    Irritates on the inside  . Metronidazole     Nausea and vomiting (taking as part of h pylori tx)  . Percocet [Oxycodone-Acetaminophen] Nausea And Vomiting  . Promethazine Hcl Other (See Comments)    seizures    Prior to Admission medications   Medication Sig Start Date End Date Taking? Authorizing Provider  clonazePAM (KLONOPIN) 1 MG tablet Take 1 tablet (1 mg total) by mouth at bedtime as needed for anxiety. 03/24/17  Yes Shawnee Knapp, MD  ibuprofen (ADVIL,MOTRIN) 600 MG tablet Take 1 tablet (600 mg total) by mouth every 8 (eight) hours as needed. 09/15/17  Yes Rutherford Guys, MD  lisdexamfetamine (VYVANSE) 50 MG capsule Take 1 capsule (50 mg total) by mouth daily. Patient not taking: Reported on 06/18/2018 11/06/17   Rutherford Guys, MD  omeprazole (PRILOSEC) 40 MG capsule Take 1 capsule (40 mg total) by mouth daily. Patient not taking: Reported on 06/18/2018 11/20/17   Rutherford Guys, MD    Past Medical  History:  Diagnosis Date  . ADD (attention deficit disorder)   . Asthma   . Atypical chest pain 02/17/2017  . Bipolar 1 disorder ([place])   . Cancer ([place])   . Depression   . Hyperglycemia   . Hypoglycemia   . Migraines   . Palpitations 02/17/2017    Past Surgical History:  Procedure Laterality Date  . CESAREAN SECTION    . TUBAL LIGATION      Social History   Tobacco Use  . Smoking status: Never Smoker  . Smokeless tobacco: Never Used  Substance Use Topics  . Alcohol use: No    Family History  Problem Relation Age of Onset  . COPD Mother   . Hypertension Father   . Heart  failure Maternal Grandmother     ROS  Objective  Vitals as reported by the patient:  There were no vitals filed for this visit.  ASSESSMENT and PLAN  1. Attention deficit disorder (ADD) without hyperactivity  Other orders - amphetamine-dextroamphetamine (ADDERALL) 10 MG tablet; Take 1 tablet (10 mg total) by mouth 2 (two) times daily with a meal.  FOLLOW-UP: 4 weeks   The above assessment and management plan was discussed with the patient. The patient verbalized understanding of and has agreed to the management plan. Patient is aware to call the clinic if symptoms persist or worsen. Patient is aware when to return to the clinic for a follow-up visit. Patient educated on when it is appropriate to go to the emergency department.    I provided 20 minutes of non-face-to-face time during this encounter.  Rutherford Guys, MD Primary Care at [place], [place] [address] Ph.  [phone] Fax [phone]

## 2018-06-28 ENCOUNTER — Encounter: Payer: Self-pay | Admitting: Family Medicine

## 2018-06-28 DIAGNOSIS — F988 Other specified behavioral and emotional disorders with onset usually occurring in childhood and adolescence: Secondary | ICD-10-CM

## 2018-07-01 ENCOUNTER — Telehealth: Payer: Self-pay | Admitting: Family Medicine

## 2018-07-01 NOTE — Telephone Encounter (Signed)
Patient called today and left a voicemail, she also sent a mychart message on 06/28/2018:  Colleen, Mccullough  to Rutherford Guys, MD   Hey Dr. Pamella Pert, so a few weeks ago you put me on the off brand of Adderal and its not working very well. I took it like you told me and even moved up to 2 in the morning and it feels like its barely doing much. I wanted to ask you if you could possibly try me on Ritalin since its supposed to react faster and last a little longer? If you can switch it, I did have a coupon for it on the good [employer] site for 17.00 dollars, but that was for 20 mg. Anyway, please let me know what you can do. Stay safe. Thank you    Please advise

## 2018-07-01 NOTE — Telephone Encounter (Signed)
See message below °

## 2018-07-22 ENCOUNTER — Ambulatory Visit: Payer: BLUE CROSS/BLUE SHIELD | Admitting: Family Medicine

## 2018-10-28 ENCOUNTER — Encounter: Payer: Self-pay | Admitting: Family Medicine

## 2018-10-30 ENCOUNTER — Telehealth: Payer: Self-pay | Admitting: Family Medicine

## 2018-10-30 NOTE — Telephone Encounter (Signed)
Spoke with pt to schedule appt for weakness, potential uti, work note, and vitamin levels check. Pt refused nurse visit for uti, pt refused earlier office appt with another provider.

## 2018-11-06 ENCOUNTER — Ambulatory Visit (INDEPENDENT_AMBULATORY_CARE_PROVIDER_SITE_OTHER): Payer: BLUE CROSS/BLUE SHIELD | Admitting: Family Medicine

## 2018-11-06 ENCOUNTER — Encounter: Payer: Self-pay | Admitting: Family Medicine

## 2018-11-06 ENCOUNTER — Other Ambulatory Visit: Payer: Self-pay

## 2018-11-06 VITALS — BP 114/76 | HR 98 | Temp 98.7°F | Ht 62.0 in | Wt 137.0 lb

## 2018-11-06 DIAGNOSIS — R5383 Other fatigue: Secondary | ICD-10-CM | POA: Diagnosis not present

## 2018-11-06 DIAGNOSIS — Z20822 Contact with and (suspected) exposure to covid-19: Secondary | ICD-10-CM

## 2018-11-06 DIAGNOSIS — R6889 Other general symptoms and signs: Secondary | ICD-10-CM

## 2018-11-06 DIAGNOSIS — R350 Frequency of micturition: Secondary | ICD-10-CM | POA: Diagnosis not present

## 2018-11-06 DIAGNOSIS — Z8639 Personal history of other endocrine, nutritional and metabolic disease: Secondary | ICD-10-CM | POA: Diagnosis not present

## 2018-11-06 LAB — POCT CBC
Granulocyte percent: 65.1 %G (ref 37–80)
HCT, POC: 40 % (ref 29–41)
Hemoglobin: 13.6 g/dL (ref 11–14.6)
Lymph, poc: 2.1 (ref 0.6–3.4)
MCH, POC: 29.2 pg (ref 27–31.2)
MCHC: 33.9 g/dL (ref 31.8–35.4)
MCV: 86 fL (ref 76–111)
MID (cbc): 0.4 (ref 0–0.9)
MPV: 7.7 fL (ref 0–99.8)
POC Granulocyte: 4.6 (ref 2–6.9)
POC LYMPH PERCENT: 29 %L (ref 10–50)
POC MID %: 5.9 %M (ref 0–12)
Platelet Count, POC: 253 10*3/uL (ref 142–424)
RBC: 4.65 M/uL (ref 4.04–5.48)
RDW, POC: 13 %
WBC: 7.1 10*3/uL (ref 4.6–10.2)

## 2018-11-06 LAB — POCT URINALYSIS DIP (MANUAL ENTRY)
Bilirubin, UA: NEGATIVE
Blood, UA: NEGATIVE
Glucose, UA: NEGATIVE mg/dL
Ketones, POC UA: NEGATIVE mg/dL
Leukocytes, UA: NEGATIVE
Nitrite, UA: NEGATIVE
Protein Ur, POC: NEGATIVE mg/dL
Spec Grav, UA: >=1.03 — AB (ref 1.010–1.025)
Urobilinogen, UA: 0.2 E.U./dL
pH, UA: 5 (ref 5.0–8.0)

## 2018-11-06 NOTE — Progress Notes (Signed)
8/28/20202:32 PM  Colleen Mccullough 1970-10-16, 48 y.o., female [id number]  Chief Complaint  Patient presents with  . Urinary Urgency    for 1 wk  . Fatigue    has been feeling this way for about 3 wks now, Works as Mining engineer, asking for a note in order to show she is eligible for their financial assistance    HPI:   Patient is a 48 y.o. 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 reports she has been working almost daily  She is requesting a [employer] for financial assistance to keep her out from work if she gets quarantined for testing  She has not been feeling well for the past 2-3 weeks  She is feeling really run down and tired No fever but has been having chills Has sore throat, body aches No cough, no worsening SOB Mild nausea which is chronic No vomiting or diarrhea No changes in taste or smell No known exposures  Has not been drinking lots water  Depression screen [facility] 2/9 11/06/2018 06/18/2018 11/05/2017  Decreased Interest 0 1 0  Down, Depressed, Hopeless 0 1 0  PHQ - 2 Score 0 2 0  Altered sleeping - 1 -  Tired, decreased energy - 2 -  Change in appetite - 0 -  Feeling bad or failure about yourself  - 3 -  Trouble concentrating - 3 -  Moving slowly or fidgety/restless - 0 -  Suicidal thoughts - 0 -  PHQ-9 Score - 11 -  Difficult doing work/chores - Somewhat difficult -    Fall Risk  11/06/2018 06/18/2018 06/18/2018 11/05/2017 11/05/2017  Falls in the past year? 0 0 0 No No  Number falls in past yr: 0 0 0 - -  Injury with Fall? 0 0 0 - -  Follow up - Falls evaluation completed Falls evaluation completed - -     Allergies  Allergen Reactions  . Amoxicillin Other (See Comments)    White splotches Has patient had a PCN reaction causing immediate rash, facial/tongue/throat swelling, SOB or lightheadedness with hypotension: unknown Has patient had a  PCN reaction causing severe rash involving mucus membranes or skin necrosis: unknown Has patient had a PCN reaction that required hospitalization : drs office Did PCN reaction occurring within the last 10 years: No, childhood allergy If all of the above answers are "NO", then may proceed with Cephalosporin use.   . Clarithromycin     Nausea and vomiting (taking as part of h pylori tx)  . Darvocet [Propoxyphene N-Acetaminophen] Nausea And Vomiting  . Latex Other (See Comments)    Irritates on the inside  . Metronidazole     Nausea and vomiting (taking as part of h pylori tx)  . Percocet [Oxycodone-Acetaminophen] Nausea And Vomiting  . Promethazine Hcl Other (See Comments)    seizures    Prior to Admission medications   Medication Sig Start Date End Date Taking? Authorizing Provider  clonazePAM (KLONOPIN) 1 MG tablet Take 1 tablet (1 mg total) by mouth at bedtime as needed for anxiety. 03/24/17  Yes Shawnee Knapp, MD    Past Medical History:  Diagnosis Date  . ADD (attention deficit disorder)   . Asthma   . Atypical chest pain 02/17/2017  . Bipolar 1 disorder ([place])   . Cancer ([place])   . Depression   . Hyperglycemia   .  Hypoglycemia   . Migraines   . Palpitations 02/17/2017    Past Surgical History:  Procedure Laterality Date  . CESAREAN SECTION    . TUBAL LIGATION      Social History   Tobacco Use  . Smoking status: Never Smoker  . Smokeless tobacco: Never Used  Substance Use Topics  . Alcohol use: No    Family History  Problem Relation Age of Onset  . COPD Mother   . Hypertension Father   . Heart failure Maternal Grandmother     ROS Per hpi  OBJECTIVE:  Today's Vitals   11/06/18 1411  BP: 114/76  Pulse: 98  Temp: 98.7 F (37.1 C)  SpO2: 100%  Weight: 137 lb (62.1 kg)  Height: 5\' 2"  (1.575 m)   Body mass index is 25.06 kg/m.  Wt Readings from Last 3 Encounters:  11/06/18 137 lb (62.1 kg)  11/05/17 113 lb 6.4 oz (51.4 kg)  10/21/17 111 lb 9.6 oz  (50.6 kg)    Physical Exam Vitals signs and nursing note reviewed.  Constitutional:      Appearance: She is well-developed.  HENT:     Head: Normocephalic and atraumatic.     Right Ear: Hearing, tympanic membrane, ear canal and external ear normal.     Left Ear: Hearing, tympanic membrane, ear canal and external ear normal.  Eyes:     Conjunctiva/sclera: Conjunctivae normal.     Pupils: Pupils are equal, round, and reactive to light.  Neck:     Musculoskeletal: Neck supple.  Cardiovascular:     Rate and Rhythm: Normal rate and regular rhythm.     Heart sounds: Normal heart sounds. No murmur. No friction rub. No gallop.   Pulmonary:     Effort: Pulmonary effort is normal.     Breath sounds: Normal breath sounds. No wheezing or rales.  Lymphadenopathy:     Cervical: No cervical adenopathy.  Skin:    General: Skin is warm and dry.  Neurological:     Mental Status: She is alert and oriented to person, place, and time.     Results for orders placed or performed in visit on 11/06/18 (from the past 24 hour(s))  POCT urinalysis dipstick     Status: Abnormal   Collection Time: 11/06/18  2:20 PM  Result Value Ref Range   Color, UA yellow yellow   Clarity, UA clear clear   Glucose, UA negative negative mg/dL   Bilirubin, UA negative negative   Ketones, POC UA negative negative mg/dL   Spec Grav, UA >=1.030 (A) 1.010 - 1.025   Blood, UA negative negative   pH, UA 5.0 5.0 - 8.0   Protein Ur, POC negative negative mg/dL   Urobilinogen, UA 0.2 0.2 or 1.0 E.U./dL   Nitrite, UA Negative Negative   Leukocytes, UA Negative Negative  POCT CBC     Status: None   Collection Time: 11/06/18  3:04 PM  Result Value Ref Range   WBC 7.1 4.6 - 10.2 K/uL   Lymph, poc 2.1 0.6 - 3.4   POC LYMPH PERCENT 29.0 10 - 50 %L   MID (cbc) 0.4 0 - 0.9   POC MID % 5.9 0 - 12 %M   POC Granulocyte 4.6 2 - 6.9   Granulocyte percent 65.1 37 - 80 %G   RBC 4.65 4.04 - 5.48 M/uL   Hemoglobin 13.6 11 - 14.6  g/dL   HCT, POC 40.0 29 - 41 %   MCV 86.0 76 -  111 fL   MCH, POC 29.2 27 - 31.2 pg   MCHC 33.9 31.8 - 35.4 g/dL   RDW, POC 13.0 %   Platelet Count, POC 253 142 - 424 K/uL   MPV 7.7 0 - 99.8 fL    No results found.   ASSESSMENT and PLAN  1. Suspected Covid-19 Virus Infection Patient evaluated, tested and sent home with instructions for home care and Quarantine. Instructed to seek further care if symptoms worsen.  - Novel Coronavirus, NAA ([facility])  2. Fatigue, unspecified type - TSH - Vitamin D, 25-hydroxy  3. Urine frequency - POCT urinalysis dipstick - no signs of infection, push fluids - Urine Culture - pending, further action as appropriate  4. H/O Iron deficiency - POCT CBC - normal  Return in about 2 weeks (around 11/20/2018) for telemedicine only.    Rutherford Guys, MD [facility] [place], [place] [address] Ph.  [phone] Fax [phone]

## 2018-11-06 NOTE — Patient Instructions (Signed)
Person Under Monitoring Name: Colleen Mccullough  Location: [address] [place] [address]   Infection Prevention Recommendations for Individuals Confirmed to have, or Being Evaluated for, 2019 Novel Coronavirus (COVID-19) Infection Who Receive Care at Home  Individuals who are confirmed to have, or are being evaluated for, COVID-19 should follow the prevention steps below until a healthcare provider or local or state health department says they can return to normal activities.  Stay home except to get medical care You should restrict activities outside your home, except for getting medical care. Do not go to work, school, or public areas, and do not use public transportation or taxis.  Call ahead before visiting your doctor Before your medical appointment, call the healthcare provider and tell them that you have, or are being evaluated for, COVID-19 infection. This will help the healthcare provider's office take steps to keep other people from getting infected. Ask your healthcare provider to call the local or state health department.  Monitor your symptoms Seek prompt medical attention if your illness is worsening (e.g., difficulty breathing). Before going to your medical appointment, call the healthcare provider and tell them that you have, or are being evaluated for, COVID-19 infection. Ask your healthcare provider to call the local or state health department.  Wear a facemask You should wear a facemask that covers your nose and mouth when you are in the same room with other people and when you visit a healthcare provider. People who live with or visit you should also wear a facemask while they are in the same room with you.  Separate yourself from other people in your home As much as possible, you should stay in a different room from other people in your home. Also, you should use a separate bathroom, if available.  Avoid sharing household items You should not  share dishes, drinking glasses, cups, eating utensils, towels, bedding, or other items with other people in your home. After using these items, you should wash them thoroughly with soap and water.  Cover your coughs and sneezes Cover your mouth and nose with a tissue when you cough or sneeze, or you can cough or sneeze into your sleeve. Throw used tissues in a lined trash can, and immediately wash your hands with soap and water for at least 20 seconds or use an alcohol-based hand rub.  Wash your [facility] your hands often and thoroughly with soap and water for at least 20 seconds. You can use an alcohol-based hand sanitizer if soap and water are not available and if your hands are not visibly dirty. Avoid touching your eyes, nose, and mouth with unwashed hands.   Prevention Steps for Caregivers and Household Members of Individuals Confirmed to have, or Being Evaluated for, COVID-19 Infection Being Cared for in the Home  If you live with, or provide care at home for, a person confirmed to have, or being evaluated for, COVID-19 infection please follow these guidelines to prevent infection:  Follow healthcare provider's instructions Make sure that you understand and can help the patient follow any healthcare provider instructions for all care.  Provide for the patient's basic needs You should help the patient with basic needs in the home and provide support for getting groceries, prescriptions, and other personal needs.  Monitor the patient's symptoms If they are getting sicker, call his or her medical provider and tell them that the patient has, or is being evaluated for, COVID-19 infection. This will help the healthcare provider's  office take steps to keep other people from getting infected. Ask the healthcare provider to call the local or state health department.  Limit the number of people who have contact with the patient  If possible, have only one caregiver for the patient.   Other household members should stay in another home or place of residence. If this is not possible, they should stay  in another room, or be separated from the patient as much as possible. Use a separate bathroom, if available.  Restrict visitors who do not have an essential need to be in the home.  Keep older adults, very young children, and other sick people away from the patient Keep older adults, very young children, and those who have compromised immune systems or chronic health conditions away from the patient. This includes people with chronic heart, lung, or kidney conditions, diabetes, and cancer.  Ensure good ventilation Make sure that shared spaces in the home have good air flow, such as from an air conditioner or an opened window, weather permitting.  Wash your hands often  Wash your hands often and thoroughly with soap and water for at least 20 seconds. You can use an alcohol based hand sanitizer if soap and water are not available and if your hands are not visibly dirty.  Avoid touching your eyes, nose, and mouth with unwashed hands.  Use disposable paper towels to dry your hands. If not available, use dedicated cloth towels and replace them when they become wet.  Wear a facemask and gloves  Wear a disposable facemask at all times in the room and gloves when you touch or have contact with the patient's blood, body fluids, and/or secretions or excretions, such as sweat, saliva, sputum, nasal mucus, vomit, urine, or feces.  Ensure the mask fits over your nose and mouth tightly, and do not touch it during use.  Throw out disposable facemasks and gloves after using them. Do not reuse.  Wash your hands immediately after removing your facemask and gloves.  If your personal clothing becomes contaminated, carefully remove clothing and launder. Wash your hands after handling contaminated clothing.  Place all used disposable facemasks, gloves, and other waste in a lined container  before disposing them with other household waste.  Remove gloves and wash your hands immediately after handling these items.  Do not share dishes, glasses, or other household items with the patient  Avoid sharing household items. You should not share dishes, drinking glasses, cups, eating utensils, towels, bedding, or other items with a patient who is confirmed to have, or being evaluated for, COVID-19 infection.  After the person uses these items, you should wash them thoroughly with soap and water.  Wash laundry thoroughly  Immediately remove and wash clothes or bedding that have blood, body fluids, and/or secretions or excretions, such as sweat, saliva, sputum, nasal mucus, vomit, urine, or feces, on them.  Wear gloves when handling laundry from the patient.  Read and follow directions on labels of laundry or clothing items and detergent. In general, wash and dry with the warmest temperatures recommended on the label.  Clean all areas the individual has used often  Clean all touchable surfaces, such as counters, tabletops, doorknobs, bathroom fixtures, toilets, phones, keyboards, tablets, and bedside tables, every day. Also, clean any surfaces that may have blood, body fluids, and/or secretions or excretions on them.  Wear gloves when cleaning surfaces the patient has come in contact with.  Use a diluted bleach solution (e.g., dilute bleach with 1  part bleach and 10 parts water) or a household disinfectant with a label that says EPA-registered for coronaviruses. To make a bleach solution at home, add 1 tablespoon of bleach to 1 quart (4 cups) of water. For a larger supply, add  cup of bleach to 1 gallon (16 cups) of water.  Read labels of cleaning products and follow recommendations provided on product labels. Labels contain instructions for safe and effective use of the cleaning product including precautions you should take when applying the product, such as wearing gloves or eye  protection and making sure you have good ventilation during use of the product.  Remove gloves and wash hands immediately after cleaning.  Monitor yourself for signs and symptoms of illness Caregivers and household members are considered close contacts, should monitor their health, and will be asked to limit movement outside of the home to the extent possible. Follow the monitoring steps for close contacts listed on the symptom monitoring form.   ? If you have additional questions, contact your local health department or call the epidemiologist on call at [phone] (available 24/7). ? This guidance is subject to change. For the most up-to-date guidance from [facility], please refer to their website: [email or web address]

## 2018-11-07 LAB — URINE CULTURE

## 2018-11-07 LAB — VITAMIN D 25 HYDROXY (VIT D DEFICIENCY, FRACTURES): Vit D, 25-Hydroxy: 46.1 ng/mL (ref 30.0–100.0)

## 2018-11-07 LAB — TSH: TSH: 2.27 u[IU]/mL (ref 0.450–4.500)

## 2018-11-08 LAB — NOVEL CORONAVIRUS, NAA: SARS-CoV-2, NAA: NOT DETECTED

## 2018-11-20 ENCOUNTER — Telehealth: Payer: BLUE CROSS/BLUE SHIELD | Admitting: Family Medicine

## 2019-06-17 ENCOUNTER — Encounter: Payer: Self-pay | Admitting: Family Medicine

## 2019-06-17 DIAGNOSIS — F988 Other specified behavioral and emotional disorders with onset usually occurring in childhood and adolescence: Secondary | ICD-10-CM

## 2019-06-17 NOTE — Telephone Encounter (Signed)
Pt requesting a referral to psychiatry at [facility] she wants to restart her Vyvanse
